# Patient Record
Sex: Male | Born: 1975 | Race: Asian | Hispanic: No | Marital: Single | Smoking: Former smoker
Health system: Southern US, Community
[De-identification: ages and names within clinical notes are randomized; demographics above are authoritative.]

## PROBLEM LIST (undated history)

## (undated) DIAGNOSIS — K219 Gastro-esophageal reflux disease without esophagitis: Secondary | ICD-10-CM

## (undated) DIAGNOSIS — L508 Other urticaria: Secondary | ICD-10-CM

## (undated) DIAGNOSIS — R21 Rash and other nonspecific skin eruption: Secondary | ICD-10-CM

## (undated) DIAGNOSIS — Z87898 Personal history of other specified conditions: Secondary | ICD-10-CM

## (undated) DIAGNOSIS — E785 Hyperlipidemia, unspecified: Secondary | ICD-10-CM

## (undated) DIAGNOSIS — Z9109 Other allergy status, other than to drugs and biological substances: Secondary | ICD-10-CM

## (undated) DIAGNOSIS — T7840XA Allergy, unspecified, initial encounter: Secondary | ICD-10-CM

## (undated) DIAGNOSIS — G4730 Sleep apnea, unspecified: Secondary | ICD-10-CM

## (undated) HISTORY — DX: Hyperlipidemia, unspecified: E78.5

## (undated) HISTORY — DX: Rash and other nonspecific skin eruption: R21

## (undated) HISTORY — DX: Allergy, unspecified, initial encounter: T78.40XA

## (undated) HISTORY — DX: Sleep apnea, unspecified: G47.30

## (undated) HISTORY — DX: Other allergy status, other than to drugs and biological substances: Z91.09

## (undated) HISTORY — DX: Personal history of other specified conditions: Z87.898

## (undated) HISTORY — PX: NO PAST SURGERIES: SHX2092

## (undated) HISTORY — DX: Gastro-esophageal reflux disease without esophagitis: K21.9

## (undated) HISTORY — DX: Other urticaria: L50.8

---

## 2012-02-17 ENCOUNTER — Encounter (HOSPITAL_COMMUNITY): Payer: Self-pay

## 2012-02-17 ENCOUNTER — Emergency Department (HOSPITAL_COMMUNITY)
Admission: EM | Admit: 2012-02-17 | Discharge: 2012-02-17 | Disposition: A | Discharge status: Home / Self Care | Payer: Self-pay | Attending: Emergency Medicine | Admitting: Emergency Medicine

## 2012-02-17 DIAGNOSIS — IMO0002 Reserved for concepts with insufficient information to code with codable children: Secondary | ICD-10-CM | POA: Insufficient documentation

## 2012-02-17 DIAGNOSIS — H10219 Acute toxic conjunctivitis, unspecified eye: Secondary | ICD-10-CM | POA: Insufficient documentation

## 2012-02-17 DIAGNOSIS — S0510XA Contusion of eyeball and orbital tissues, unspecified eye, initial encounter: Secondary | ICD-10-CM | POA: Insufficient documentation

## 2012-02-17 DIAGNOSIS — H5789 Other specified disorders of eye and adnexa: Secondary | ICD-10-CM | POA: Insufficient documentation

## 2012-02-17 DIAGNOSIS — H5710 Ocular pain, unspecified eye: Secondary | ICD-10-CM | POA: Insufficient documentation

## 2012-02-17 DIAGNOSIS — H11419 Vascular abnormalities of conjunctiva, unspecified eye: Secondary | ICD-10-CM | POA: Insufficient documentation

## 2012-02-17 MED ORDER — HYDROCODONE-ACETAMINOPHEN 5-325 MG PO TABS: ORAL_TABLET | ORAL | Status: AC

## 2012-02-17 MED ORDER — TOBRAMYCIN 0.3 % OP OINT
TOPICAL_OINTMENT | Freq: Once | OPHTHALMIC | Status: AC
  Administered 2012-02-17: 10:00:00 via OPHTHALMIC
  Filled 2012-02-17: qty 3.5

## 2012-02-17 NOTE — ED Provider Notes (Signed)
History     CSN: [id number]  Arrival date & time 02/17/12  0827   First MD Initiated Contact with Patient 02/17/12 [phone]      Chief Complaint  Patient presents with  . Eye Injury    (Consider location/radiation/quality/duration/timing/severity/associated sxs/prior treatment) HPI Comments: Patient was opening a container of superglue today at work when it shot out and entered right eye. Patient initially irrigated the eye with water. No other treatments prior to arrival. No contact lenses. Patient notes a "scratchy" sensation in his eye. He denies change in vision. He denies eye pain, vomiting.   Patient is a 36 y.o. male presenting with eye injury. The history is provided by the patient.  Eye Injury This is a new problem. The current episode started today. The problem occurs constantly. The problem has been unchanged. Pertinent negatives include no coughing, fever, headaches, myalgias, nausea, rash, sore throat, visual change or vomiting. The symptoms are aggravated by nothing. Treatments tried: irrigation. The treatment provided no relief.    History reviewed. No pertinent past medical history.  History reviewed. No pertinent past surgical history.  No family history on file.  History  Substance Use Topics  . Smoking status: Current Everyday Smoker  . Smokeless tobacco: Not on file  . Alcohol Use: Yes     occasional      Review of Systems  Constitutional: Negative for fever.  HENT: Negative for sore throat and rhinorrhea.   Eyes: Positive for redness. Negative for photophobia, pain and visual disturbance.  Respiratory: Negative for cough.   Gastrointestinal: Negative for nausea and vomiting.  Musculoskeletal: Negative for myalgias.  Skin: Negative for rash.  Neurological: Negative for headaches.    Allergies  Review of patient's allergies indicates no known allergies.  Home Medications  No current outpatient prescriptions on file.  BP 138/98  Pulse 88   Temp(Src) 98.1 F (36.7 C) (Oral)  Resp 18  SpO2 98%  Physical Exam  Nursing note and vitals reviewed. Constitutional: He is oriented to person, place, and time. He appears well-developed and well-nourished.  HENT:  Head: Normocephalic and atraumatic.  Eyes: Pupils are equal, round, and reactive to light. Right eye exhibits discharge (Tearing). Right eye exhibits no chemosis and no exudate. Left eye exhibits no chemosis, no discharge and no exudate. Right conjunctiva is injected. Right conjunctiva has no hemorrhage. Left conjunctiva is not injected. Left conjunctiva has no hemorrhage. Right eye exhibits normal extraocular motion. Left eye exhibits normal extraocular motion.       Dried beads of glue on R eyelashes.   Neck: Normal range of motion. Neck supple.  Pulmonary/Chest: No respiratory distress.  Neurological: He is alert and oriented to person, place, and time.  Skin: Skin is warm and dry.  Psychiatric: He has a normal mood and affect.    ED Course  Procedures (including critical care time)  Labs Reviewed - No data to display No results found.   1. Chemical conjunctivitis     9:10AM Patient seen and examined. Work-up initiated. Medications ordered.   Vital signs reviewed and are as follows: Filed Vitals:   02/17/12 0829  BP: 138/98  Pulse: 88  Temp: 98.1 F (36.7 C)  Resp: 18   Patient was discussed with Forbes Cellar, MD  9:32 AM Chemical conjunctivitis. Will apply tobrex ointment after performing visual acuity.   10:08 AM Visual acuity initially listed as unable to be obtained with R eye. Nurse and myself repeated exam, patient had better than 20/30 vision in  Right eye.   Urged follow-up with ophthalmology in 2 days. Patient urged to return with worsening pain, vision loss, swelling around eye, fever, or other concerns. He verbalizes understanding and agrees with plan.   MDM  Super glue into eye causing a chemical conjunctivitis. Vision is at patient's  baseline. Antibiotic ointment to prevent infection and help dissolve glue. Not concerned for ulcer or abrasion at this point. Ophtho referral given.         Renne Crigler, [place] 02/17/12 1019

## 2012-02-17 NOTE — Discharge Instructions (Signed)
Please read and follow all provided instructions.  Your diagnoses today include:  1. Chemical conjunctivitis    Tests performed today include:  Visual acuity testing to check your vision  Vital signs. See below for your results today.   Medications prescribed:   Tobrex (tobramycin) - antibiotic eye drops or eye ointment  Use this medication as follows:  Apply 1/4" of the antibiotic ointment to your right eye 5 times per day.     Vicodin (hydrocodone/acetaminophen) - narcotic pain medication  You have been prescribed narcotic pain medication such as Vicodin or Percocet: DO NOT drive or perform any activities that require you to be awake and alert because this medicine can make you drowsy. Do not take any other medications that contain tylenol while taking this medication because you might take too much.   Take any prescribed medications only as directed.  Home care instructions:  Follow any educational materials contained in this packet.  Follow-up instructions: Please follow-up with the opthalmologist listed in the next 2 days for further evaluation of your symptoms.  If you do not have a primary care doctor -- see below for referral information.   Return instructions:   Please return to the Emergency Department if you experience worsening symptoms.   Please return immediately if you develop severe pain, pus drainage, new change in vision, or fever.  Please return if you have any other emergent concerns.  Additional Information:  Your vital signs today were: BP 138/98  Pulse 88  Temp(Src) 98.1 F (36.7 C) (Oral)  Resp 18  SpO2 98% If your blood pressure (BP) was elevated above 135/85 this visit, please have this repeated by your doctor within one month. -------------- No Primary Care Doctor Call [facility]  [phone] Other agencies that provide inexpensive medical care    [facility]  [phone]    [facility]  [phone]  [facility]  [phone]    [facility]  [phone]    [facility]  [phone]    [facility]  [phone] -------------- RESOURCE GUIDE:  Dental Problems  Patients with Medicaid: [facility] [phone]                                            [phone] [address] Phone:  [phone]                                                   Phone:  [phone]  If unable to pay or uninsured, contact:  [facility] or [facility]. to become qualified for the adult dental clinic.  Chronic Pain Problems Contact [facility]  [phone] Patients need to be referred by their primary care doctor.  Insufficient Money for Medicine Contact [employer]:  call "211" or [facility] [phone].  Psychological Services [facility]  [phone] [facility]  [phone] [facility]   [phone] (emergency services [phone])  Substance Abuse Resources [facility]  [phone] [facility] [phone] [facility] [phone]  [facility] [phone] Residential & Outpatient Substance Abuse Program  [phone]  Abuse/Neglect [facility] Child Abuse Hotline [phone] [facility] Child Abuse Hotline [phone] (After Hours)  Emergency Shelter [facility] [phone]  Maternity Homes [facility] [phone] [facility] [phone]  [facility]  [facility]     [employer]                          [facility] Dept. [address] [place]                       [address]      [address]  [place]ondell Reveal Phone:  [phone]                                   Phone:  [phone]                  Phone:  [phone]  [facility] Mental [facility] Phone:  [phone]  [facility] [address] Child Abuse Hotline [phone])[phone] [phone] (After Hours)

## 2012-02-17 NOTE — ED Provider Notes (Signed)
Medical screening examination/treatment/procedure(s) were performed by non-physician practitioner and as supervising physician I was immediately available for consultation/collaboration.   Forbes Cellar, MD 02/17/12 1024

## 2012-02-17 NOTE — ED Notes (Signed)
Pt. Got super glue into his Rt. Eye,   Unable to open it, glued shut

## 2012-02-17 NOTE — ED Notes (Signed)
Pt reports accidentally squirting super glue in his (R) eye this am, eye can be opened w/assistance, pt reports pain while trying to attempt to open eye.

## 2012-10-01 ENCOUNTER — Ambulatory Visit: Payer: Self-pay | Admitting: Internal Medicine

## 2012-10-01 VITALS — BP 124/79 | HR 82 | Temp 98.2°F | Resp 16 | Ht 66.5 in | Wt 161.8 lb

## 2012-10-01 DIAGNOSIS — T783XXA Angioneurotic edema, initial encounter: Secondary | ICD-10-CM

## 2012-10-01 DIAGNOSIS — R209 Unspecified disturbances of skin sensation: Secondary | ICD-10-CM

## 2012-10-01 DIAGNOSIS — S41109A Unspecified open wound of unspecified upper arm, initial encounter: Secondary | ICD-10-CM

## 2012-10-01 MED ORDER — METHYLPREDNISOLONE ACETATE 80 MG/ML IJ SUSP
120.0000 mg | Freq: Once | INTRAMUSCULAR | Status: AC
  Administered 2012-10-01: 120 mg via INTRAMUSCULAR

## 2012-10-01 NOTE — Progress Notes (Signed)
  Subjective:    Patient ID: Ricardo Gibbs, male    DOB: 03/10/1976, 36 y.o.   MRN: [id number]  HPI Has definite insect bite right shoulder, occurred last night in bed. Has gradually enlarging ring of angioedema from wound. Itchy and tingly, no pain. No allergy hx.   Review of Systems     Objective:   Physical Exam  Constitutional: He is oriented to person, place, and time. He appears well-developed and well-nourished.  HENT:  Mouth/Throat: Oropharynx is clear and moist.  Cardiovascular: Normal rate.   Pulmonary/Chest: Effort normal and breath sounds normal.  Musculoskeletal: He exhibits edema.  Neurological: He is alert and oriented to person, place, and time.  Skin: Lesion and rash noted. Rash is maculopapular.          Edematous border  Psychiatric: He has a normal mood and affect.          Assessment & Plan:  depomedrol 120mg  Zyrtec 10mg  now Benedryl 50mg  after work [employer]. RTC if worse

## 2012-10-01 NOTE — Patient Instructions (Signed)
Spider Bite Spider bites are not common. Most spider bites do not cause serious problems. The elderly, very young children, and people with certain existing medical conditions are more likely to experience significant symptoms. SYMPTOMS  Spider bites may not cause any pain at first. Within 1 or 2 days of the bite, there may be swelling, redness, and pain in the bite area. However, some spider bites can cause pain within the first hour. TREATMENT  Your caregiver may prescribe antibiotic medicine if a bacterial infection develops in the bite. However, not all spider bites require antibiotics or prescription medicines.  HOME CARE INSTRUCTIONS  Do not scratch the bite area.  Keep the bite area clean and dry. Wash the area with soap and water as directed.  Put ice or cool compresses on the bite area.  Put ice in a plastic bag.  Place a towel between your skin and the bag.  Leave the ice on for 20 minutes, 4 times a day for the first 2 to 3 days, or as directed.  Keep the bite area elevated above the level of your heart. This helps reduce redness and swelling.  Only take over-the-counter or prescription medicines as directed by your caregiver.  If you are given antibiotics, take them as directed. Finish them even if you start to feel better. You may need a tetanus shot if:  You cannot remember when you had your last tetanus shot.  You have never had a tetanus shot.  The injury broke your skin. If you get a tetanus shot, your arm may swell, get red, and feel warm to the touch. This is common and not a problem. If you need a tetanus shot and you choose not to have one, there is a rare chance of getting tetanus. Sickness from tetanus can be serious. SEEK MEDICAL CARE IF: Your bite is not better after 3 days of treatment. SEEK IMMEDIATE MEDICAL CARE IF:  Your bite turns purple or develops increased swelling, pain, or redness.  You develop shortness of breath or chest pain.  You have  muscle cramps or painful muscle spasms.  You develop abdominal pain, nausea, or vomiting.  You feel unusually tired or sleepy. MAKE SURE YOU:  Understand these instructions.  Will watch your condition.  Will get help right away if you are not doing well or get worse. Document Released: 12/14/2004 Document Revised: 01/29/2012 Document Reviewed: 06/07/2011 [facility] Patient Information 2013 [place], [place]. Angioedema Angioedema (AE) is a sudden swelling of the eyelids, lips, lobes of ears, external genitalia, skin, and other parts of the body. AE can happen by itself. It usually begins during the night and is found on awakening. It can happen with hives and other allergic reactions. Attacks can be mild and annoying, or life-threatening if the air passages swell. AE generally occurs in a short time period (over minutes to hours) and gets better in 24 to 48 hours. It usually does not cause any serious problems.  There are 2 different kinds of AE:   Allergic AE.  Nonallergic AE.  There may be an overreaction or direct stimulation of cells that are a part of the immune system (mast cells).  There may be problems with the release of chemicals made by the body that cause swelling and inflammation (kinins). AE due to kinins can be inherited from parents (hereditary), or it can develop on its own (acquired). Acquired AE either shows up before, or along with, certain diseases or is due to the body's immune system  attacking parts of the body's own cells (autoimmune). CAUSES  Allergic  AE due to allergic reactions are caused by something that causes the body to react (trigger). Common triggers include:  Foods.  Medicines.  Latex.  Direct contact with certain fruits, vegetables, or animal saliva.  Insect stings. Nonallergic  Mast cell stimulation may be caused by:  Medicines.  Dyes used in X-rays.  The body's own immune system reactions to parts of the body (autoimmune  disease).  Possibly, some virus infections.  AE due to problems with kinins can be hereditary or acquired. Attacks are triggered by:  Mild injury.  Dental work or any surgery.  Stress.  Sudden changes in temperature.  Exercise.  Medicines.  AE due to problems with kinins can also be due to certain medicines, especially blood pressure medicines like angiotensin-converting enzyme (ACE) inhibitors. African Americans are at nearly 5 times greater risk of developing AE than Caucasians from ACE inhibitors. SYMPTOMS  Allergic symptoms:  Non-itchy swelling of the skin. Often the swelling is on the face and lips, but any area of the skin can swell. Sometimes, the swelling can be painful. If hives are present, there is intense itching.  Breathing problems if the air passages swell. Nonallergic symptoms:  If internal organs are involved, there may be:  Nausea.  Abdominal pain.  Vomiting.  Difficulty swallowing.  Difficulty passing urine.  Breathing problems if the air passages swell. Depending on the cause of AE, episodes may:  Only happen once (if triggers are removed or avoided).  Come back in unpredictable patterns.  Repeat for several years and then gradually fade away. DIAGNOSIS  AE is diagnosed by:   Asking questions to find out how fast the symptoms began.  Taking a family history.  Physical exam.  Diagnostic tests. Tests could include:  Allergy skin tests to see if the problem is allergic.  Blood tests to diagnose hereditary and some acquired types of AE.  Other tests to see if there is a hidden disease leading to the AE. TREATMENT  Treatment depends on the type and cause (if any) of the AE. Allergic  Allergic types of AE are treated with:  Immediate removal of the trigger or medicine (if any).  Epinephrine injection.  Steroids.  Antihistamines.  Hospitalization for severe attacks. Nonallergic  Mast cell stimulation types of AE are treated  with:  Immediate removal of the trigger or medicine (if any).  Epinephrine injection.  Steroids.  Antihistamines.  Hospitalization for severe attacks.  Hereditary AE is treated with:  Medicines to prevent and treat attacks. There is little response to antihistamines, epinephrine, or steroids.  Preventive medicines before dental work or surgery.  Removing or avoiding medicines that trigger attacks.  Hospitalization for severe attacks.  Acquired AE is treated with:  Treating underlying disease (if any).  Medicines to prevent and treat attacks. HOME CARE INSTRUCTIONS   Always carry your emergency allergy treatment medicines with you.  Wear a medical bracelet.  Avoid known triggers. SEEK MEDICAL CARE IF:   You get repeat attacks.  Your attacks are more frequent or more severe despite preventive measures.  You have hereditary AE and are considering having children. It is important to discuss the risks of passing this on to your children. SEEK IMMEDIATE MEDICAL CARE IF:   You have difficulty breathing.  You have difficulty swallowing.  You experience fainting. This condition should be treated immediately. It can be life-threatening if it involves throat swelling. Document Released: 01/15/2002 Document Revised: 01/29/2012 Document  Reviewed: 11/05/2008 [employer] Patient Information 2013 [place], [place].

## 2013-09-11 ENCOUNTER — Telehealth: Payer: Self-pay

## 2013-09-11 NOTE — Telephone Encounter (Signed)
Medication List and allergies: reviewed  90 day supply/mail order: na Local prescriptions: [facility]  Immunizations due: as determined by provider  A/P:   Opti drop and additional allergy med will be brought to appt Reviewed FH, SH, PMH No history of illness in his family  Currently seeing allergist for continual rash that will not go away No immunization records or PCP records  To Discuss with Provider: Experiences chronic constipation

## 2013-09-15 ENCOUNTER — Ambulatory Visit
Admission: RE | Admit: 2013-09-15 | Discharge: 2013-09-15 | Disposition: A | Discharge status: Home / Self Care | Payer: BC Managed Care – PPO | Source: Ambulatory Visit | Attending: Family Medicine | Admitting: Family Medicine

## 2013-09-15 ENCOUNTER — Encounter: Payer: Self-pay | Admitting: Family Medicine

## 2013-09-15 ENCOUNTER — Ambulatory Visit (INDEPENDENT_AMBULATORY_CARE_PROVIDER_SITE_OTHER): Payer: BC Managed Care – PPO | Admitting: Family Medicine

## 2013-09-15 VITALS — BP 120/80 | HR 69 | Temp 98.4°F | Ht 67.0 in | Wt 161.4 lb

## 2013-09-15 DIAGNOSIS — K5901 Slow transit constipation: Secondary | ICD-10-CM

## 2013-09-15 DIAGNOSIS — K5900 Constipation, unspecified: Secondary | ICD-10-CM

## 2013-09-15 LAB — POCT URINALYSIS DIPSTICK
Bilirubin, UA: NEGATIVE
Ketones, UA: NEGATIVE
Leukocytes, UA: NEGATIVE
Spec Grav, UA: 1.015
pH, UA: 6

## 2013-09-15 MED ORDER — LINACLOTIDE 145 MCG PO CAPS: 145.0000 ug | ORAL_CAPSULE | Freq: Every day | ORAL | Status: DC

## 2013-09-15 NOTE — Progress Notes (Signed)
  Subjective:     Ricardo Gibbs is a 37 y.o. male who presents for evaluation of constipation. Onset was several years ago. Patient has been having rare pellet like stools 1x a week. Defecation has been difficult and painful. Co-Morbid conditions:stress. Symptoms have been constant. Current Health Habits: Eating fiber? yes - metamucil and fiber one, Exercise? no, Adequate hydration? no. Current over the counter/prescription laxative: metamucil , stool softener,  ducolax which has been ineffective.  The following portions of the patient's history were reviewed and updated as appropriate: allergies, current medications, past family history, past medical history, past social history, past surgical history and problem list.  Review of Systems Pertinent items are noted in HPI.   Objective:    BP 120/80  Pulse 69  Temp(Src) 98.4 F (36.9 C) (Oral)  Ht 5\' 7"  (1.702 m)  Wt 161 lb 6.4 oz (73.211 kg)  BMI 25.27 kg/m2  SpO2 97% General appearance: alert, cooperative, appears stated age and no distress Lungs: clear to auscultation bilaterally Heart: S1, S2 normal Abdomen: soft, non-tender; bowel sounds normal; no masses,  no organomegaly   Assessment:    Constipation   Plan:    Education about constipation causes and treatment discussed. Laboratory tests per orders. Plain films (flat plate/upright). GI consult. samples linzess 145 mcg

## 2013-09-15 NOTE — Patient Instructions (Signed)

## 2013-09-16 LAB — BASIC METABOLIC PANEL WITH GFR
BUN: 12 mg/dL (ref 6–23)
CO2: 28 meq/L (ref 19–32)
Calcium: 9.5 mg/dL (ref 8.4–10.5)
Chloride: 100 meq/L (ref 96–112)
Creatinine, Ser: 1.1 mg/dL (ref 0.4–1.5)
GFR: 83.75 mL/min
Glucose, Bld: 85 mg/dL (ref 70–99)
Potassium: 3.7 meq/L (ref 3.5–5.1)
Sodium: 138 meq/L (ref 135–145)

## 2013-09-16 LAB — CBC WITH DIFFERENTIAL/PLATELET
Basophils Absolute: 0 10*3/uL (ref 0.0–0.1)
Basophils Relative: 0.4 % (ref 0.0–3.0)
Eosinophils Absolute: 0.2 10*3/uL (ref 0.0–0.7)
Eosinophils Relative: 2.5 % (ref 0.0–5.0)
HCT: 47.2 % (ref 39.0–52.0)
Hemoglobin: 16.5 g/dL (ref 13.0–17.0)
Lymphocytes Relative: 29.4 % (ref 12.0–46.0)
Lymphs Abs: 2.3 10*3/uL (ref 0.7–4.0)
MCHC: 34.9 g/dL (ref 30.0–36.0)
MCV: 89 fl (ref 78.0–100.0)
Monocytes Absolute: 0.6 10*3/uL (ref 0.1–1.0)
Monocytes Relative: 7.2 % (ref 3.0–12.0)
Neutro Abs: 4.7 10*3/uL (ref 1.4–7.7)
Neutrophils Relative %: 60.5 % (ref 43.0–77.0)
Platelets: 249 10*3/uL (ref 150.0–400.0)
RBC: 5.3 Mil/uL (ref 4.22–5.81)
RDW: 12.5 % (ref 11.5–14.6)
WBC: 7.7 10*3/uL (ref 4.5–10.5)

## 2013-09-16 LAB — HEPATIC FUNCTION PANEL
AST: 21 U/L (ref 0–37)
Bilirubin, Direct: 0 mg/dL (ref 0.0–0.3)
Total Bilirubin: 0.7 mg/dL (ref 0.3–1.2)

## 2013-09-16 LAB — TSH: TSH: 2.63 u[IU]/mL (ref 0.35–5.50)

## 2013-10-02 ENCOUNTER — Encounter: Payer: Self-pay | Admitting: Internal Medicine

## 2013-11-03 ENCOUNTER — Encounter: Payer: Self-pay | Admitting: Internal Medicine

## 2013-11-04 ENCOUNTER — Other Ambulatory Visit (INDEPENDENT_AMBULATORY_CARE_PROVIDER_SITE_OTHER): Payer: BC Managed Care – PPO

## 2013-11-04 ENCOUNTER — Ambulatory Visit (INDEPENDENT_AMBULATORY_CARE_PROVIDER_SITE_OTHER): Payer: BC Managed Care – PPO | Admitting: Internal Medicine

## 2013-11-04 ENCOUNTER — Encounter: Payer: Self-pay | Admitting: Internal Medicine

## 2013-11-04 VITALS — BP 108/76 | HR 92 | Ht 65.0 in | Wt 163.1 lb

## 2013-11-04 DIAGNOSIS — R197 Diarrhea, unspecified: Secondary | ICD-10-CM

## 2013-11-04 DIAGNOSIS — K5900 Constipation, unspecified: Secondary | ICD-10-CM

## 2013-11-04 LAB — IGA: IgA: 209 mg/dL (ref 68–378)

## 2013-11-04 NOTE — Patient Instructions (Addendum)
You have been given a separate informational sheet regarding your tobacco use, the importance of quitting and local resources to help you quit. Your physician has requested that you go to the basement for the following lab work before leaving today: Celiac panel. Blood type  Take Linzess 145 mcg every other day.  Call us in 3-4 weeks to let us know if this has helped your diarrhea.                                               We are excited to introduce MyChart, a new best-in-class service that provides you online access to important information in your electronic medical record. We want to make it easier for you to view your health information - all in one secure location - when and where you need it. We expect MyChart will enhance the quality of care and service we provide.  When you register for MyChart, you can:    View your test results.    Request appointments and receive appointment reminders via email.    Request medication renewals.    View your medical history, allergies, medications and immunizations.    Communicate with your physician's office through a password-protected site.    Conveniently print information such as your medication lists.  To find out if MyChart is right for you, please talk to a member of our clinical staff today. We will gladly answer your questions about this free health and wellness tool.  If you are age 20 or older and want a member of your family to have access to your record, you must provide written consent by completing a proxy form available at our office. Please speak to our clinical staff about guidelines regarding accounts for patients younger than age 69.  As you activate your MyChart account and need any technical assistance, please call the MyChart technical support line at [phone]) or email your question to [email or web address]. If you email your question(s), please include your name, a return phone number and  the best time to reach you.  If you have non-urgent health-related questions, you can send a message to our office through MyChart at [email or web address]. If you have a medical emergency, call 911.  Thank you for using MyChart as your new health and wellness resource!   MyChart licensed from [employer],  1191-4782. Patents Pending.

## 2013-11-04 NOTE — Progress Notes (Signed)
Patient ID: Ricardo Gibbs, male   DOB: 03-Jul-1976, 37 y.o.   MRN: [id number] HPI: Ricardo Gibbs is a 37 yo male with past medical history of angioedema and GERD who is seen in consultation at request of Dr. Laury Axon to evaluate constipation.  He reports somewhat long-standing constipation which is associated with lower and at times right-sided abdominal discomfort. He tried over-the-counter laxatives such as Dulcolax but found these to be ineffective. He was started on Linzess 145 mcg daily which she says has relieved his constipation but he is having diarrhea. He is having at least 2-3 watery stools daily. At times he's had "accidents" due to urgent stools associated with Linzess. He never had fecal incontinence or accidents before. He's also noted some mild abdominal bloating with Linzess which also wasn't present before. He's never had blood in his stool nor melena. Appetite has remained good. No weight loss. No nausea or vomiting. No heartburn. 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 He knows this doesn't help neither constipation and hasn't worked with a loose stools associated with Linzess. Since starting Linzess he's had no further abdominal pain, which was mild before when constipated.  No family history of GI malignancy, celiac disease or IBD  Past Medical History  Diagnosis Date  . Hx of angioedema   . Rash   . Environmental allergies     Pecan Pollen  . GERD (gastroesophageal reflux disease)     History reviewed. No pertinent past surgical history.  Current Outpatient Prescriptions  Medication Sig Dispense Refill  . levocetirizine (XYZAL) 5 MG tablet Take 5 mg by mouth every evening.      . Linaclotide (LINZESS) 145 MCG CAPS capsule Take 1 capsule (145 mcg total) by mouth daily.  30 capsule  2   No current facility-administered medications for this visit.    No Known Allergies  Family  History  Problem Relation Age of Onset  . Stomach cancer Paternal Grandmother   . Thyroid disease Mother     Removal of the Thyroid    History  Substance Use Topics  . Smoking status: Current Every Day Smoker    Types: Cigarettes  . Smokeless tobacco: Never Used  . Alcohol Use: Yes     Comment: occasional    ROS: As per history of present illness, otherwise negative  BP 108/76  Pulse 92  Ht 5\' 5"  (1.651 m)  Wt 163 lb 2 oz (73.993 kg)  BMI 27.15 kg/m2 Constitutional: Well-developed and well-nourished. No distress. HEENT: Normocephalic and atraumatic. Oropharynx is clear and moist. No oropharyngeal exudate. Conjunctivae are normal.  No scleral icterus. Cardiovascular: Normal rate, regular rhythm and intact distal pulses. No M/R/G Pulmonary/chest: Effort normal and breath sounds normal. No wheezing, rales or rhonchi. Abdominal: Soft, nontender, nondistended. Bowel sounds active throughout. There are no masses palpable. No hepatosplenomegaly. Extremities: no clubbing, cyanosis, or edema Lymphadenopathy: No cervical adenopathy noted. Neurological: Alert and oriented to person place and time. Skin: Skin is warm and dry. No rashes noted. Psychiatric: Normal mood and affect. Behavior is normal.  RELEVANT LABS AND IMAGING: CBC    Component Value Date/Time   WBC 7.7 09/15/2013 1451   RBC 5.30 09/15/2013 1451   HGB 16.5 09/15/2013 1451   HCT 47.2 09/15/2013 1451   PLT 249.0 09/15/2013 1451   MCV 89.0 09/15/2013 1451   MCHC 34.9 09/15/2013 1451  RDW 12.5 09/15/2013 1451   LYMPHSABS 2.3 09/15/2013 1451   MONOABS 0.6 09/15/2013 1451   EOSABS 0.2 09/15/2013 1451   BASOSABS 0.0 09/15/2013 1451    CMP     Component Value Date/Time   NA 138 09/15/2013 1451   K 3.7 09/15/2013 1451   CL 100 09/15/2013 1451   CO2 28 09/15/2013 1451   GLUCOSE 85 09/15/2013 1451   BUN 12 09/15/2013 1451   CREATININE 1.1 09/15/2013 1451   CALCIUM 9.5 09/15/2013 1451   PROT 7.0 09/15/2013 1451    ALBUMIN 4.5 09/15/2013 1451   AST 21 09/15/2013 1451   ALT 25 09/15/2013 1451   ALKPHOS 62 09/15/2013 1451   BILITOT 0.7 09/15/2013 1451   TSH normal  ASSESSMENT/PLAN:  37 yo male with past medical history of angioedema and GERD who is seen in consultation at request of Dr. Laury Axon to evaluate constipation.   1.  Constipation/now diarrhea medication associated -- he has had diarrhea since starting Linzess 145 mcg daily, but he is pleased with improvement in his abdominal discomfort.  We discussed options which include changing Linzess to every other day or changing therapy altogether. He would prefer trying Linzess 145 mcg every other day. I told him the goal is to have effective bowel movements but not necessarily diarrhea. If Linzess leads to diarrhea on the days that he takes it, I would recommend changing therapy. He understands this and agrees. He will try Linzess every other day for the next 3-4 weeks and then call my office to update me on how he has responded. If he likes the response we will continue with therapy in this manner however if he is still having intermittent diarrhea, we will plan to change to lubiprostone 8 mcg twice daily.  I will check a celiac panel today. He also asked me to check a blood type because he has always been interested to know his blood type.  Return in 2-3 months

## 2013-11-06 ENCOUNTER — Other Ambulatory Visit: Payer: Self-pay | Admitting: Gastroenterology

## 2013-11-07 ENCOUNTER — Telehealth: Payer: Self-pay | Admitting: Internal Medicine

## 2013-11-07 NOTE — Telephone Encounter (Signed)
Pt called for his Blood Type and after speaking to the lab, informed him we cannot do that test here; it would have to be done in the hospital.  Offered to order the test at a hospital and he stated that was all right.

## 2014-06-17 IMAGING — CR DG ABDOMEN 2V
2 series · 2 of 2 positions shown · non-contrast
Comparison: None.

CLINICAL DATA: Right-sided periumbilical and left-sided mid
abdominal pain for months. Question constipation.

EXAM:
ABDOMEN - 2 VIEW

[view not recorded (1 of 2)]
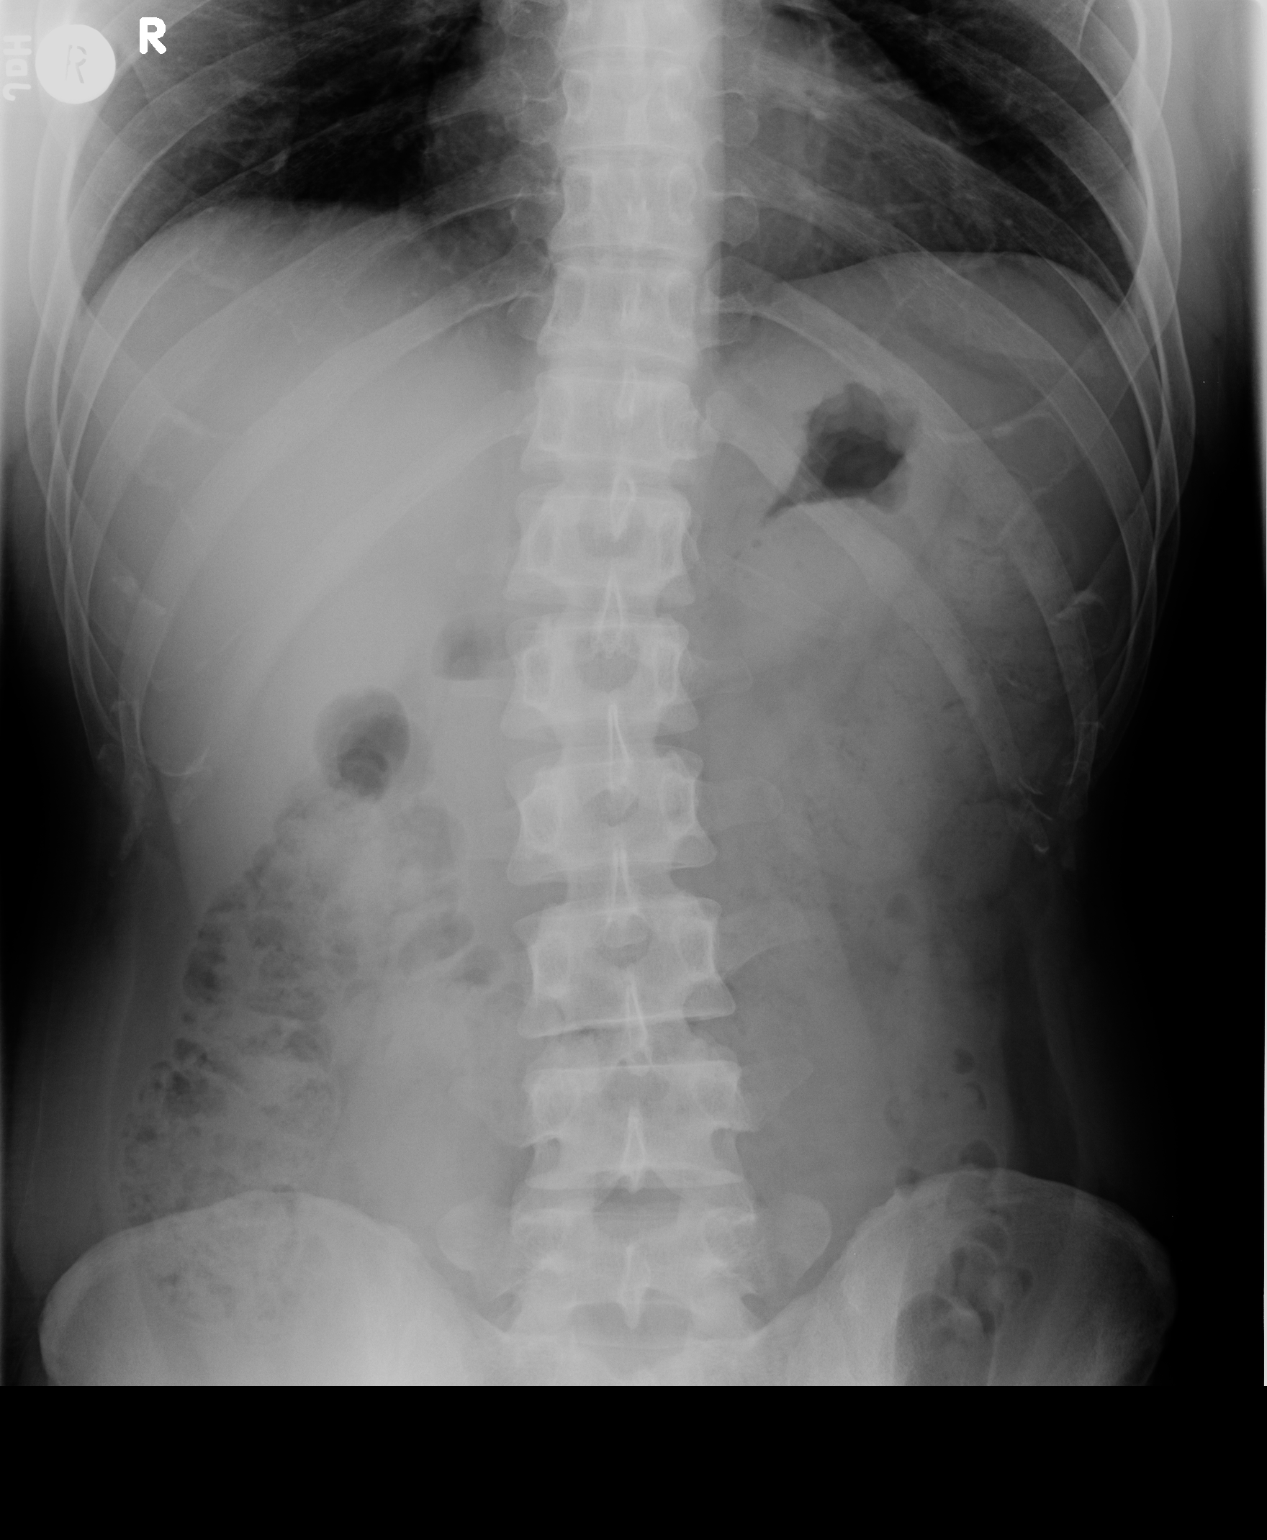

[view not recorded (2 of 2)]
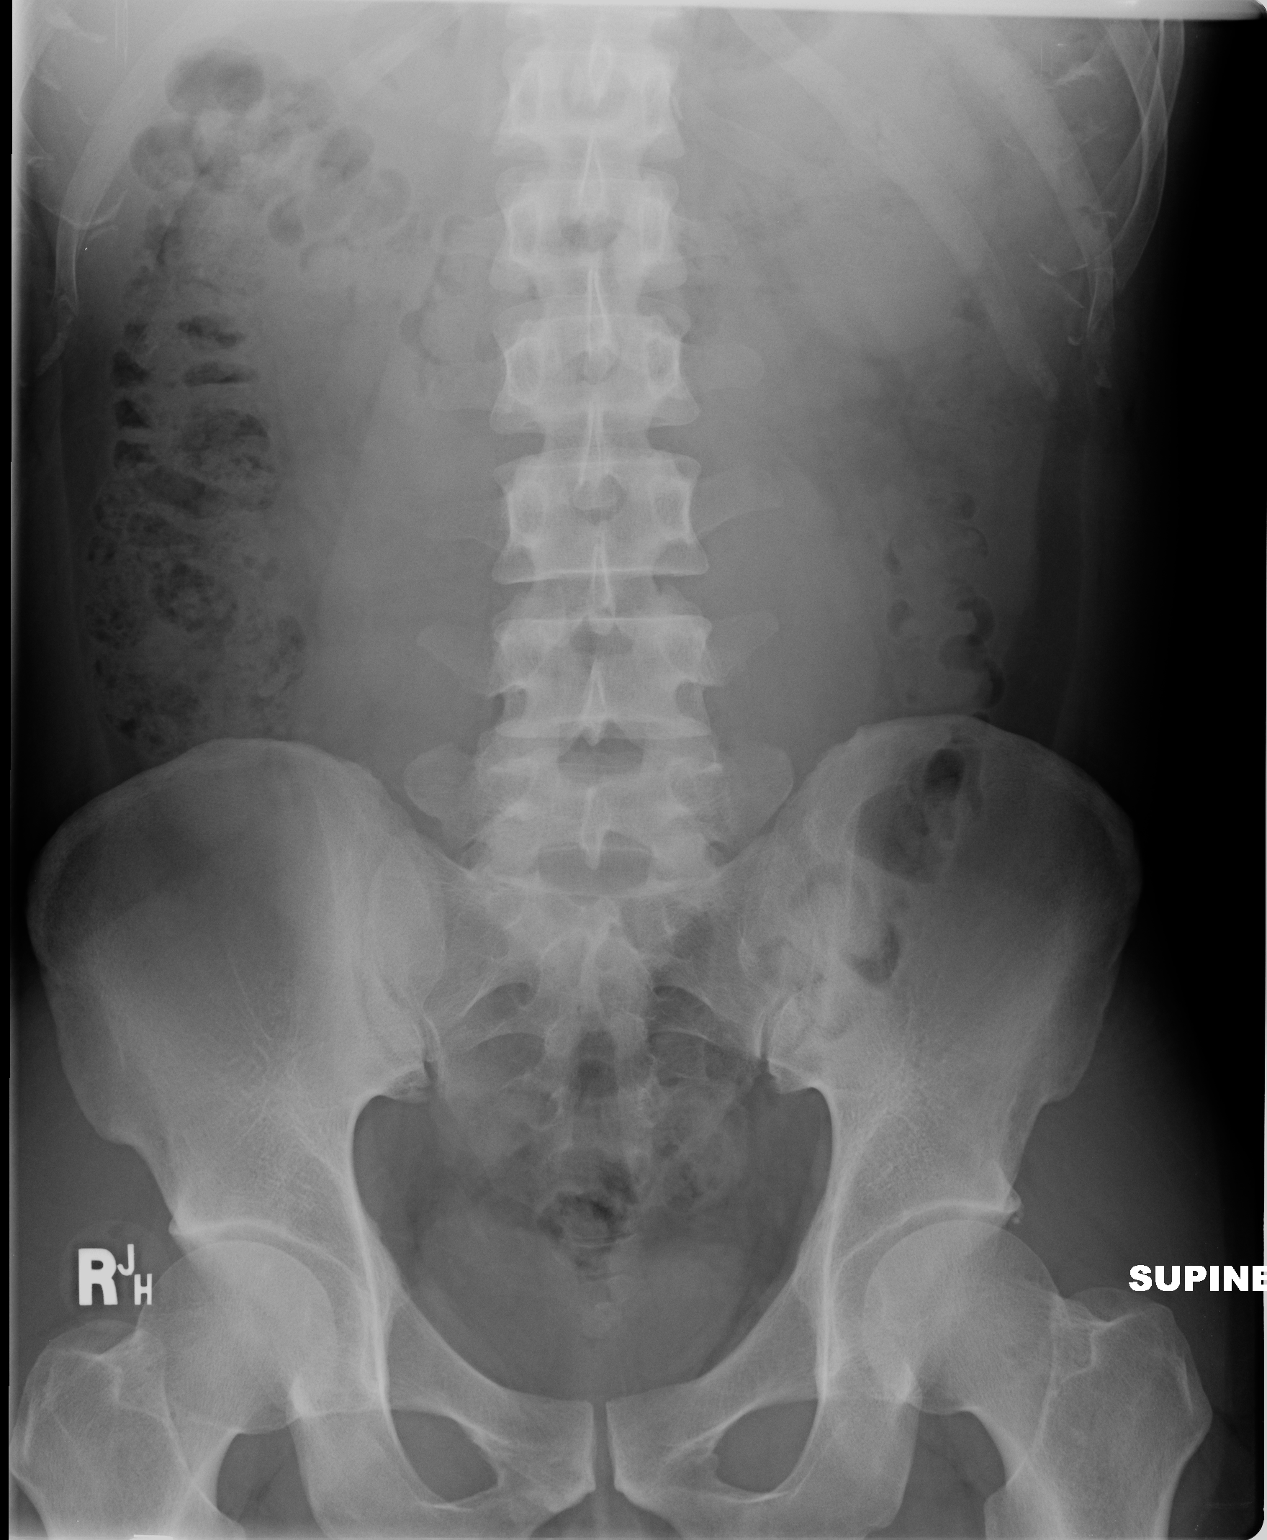

[2 of 2 positions shown; findings below may reference images not displayed]

FINDINGS: Upright and supine views. The upright view demonstrates minimal
convex right lumbar spine curvature. No free intraperitoneal air. No
significant air fluid levels.

Supine view demonstrates moderate amount of right-sided colonic
stool. No small bowel dilatation. Distal gas and stool. No abnormal
abdominal calcifications. No appendicolith.
IMPRESSION: No acute findings.

Moderate amount of ascending colonic stool.

## 2016-09-07 ENCOUNTER — Ambulatory Visit (HOSPITAL_COMMUNITY)
Admission: EM | Admit: 2016-09-07 | Discharge: 2016-09-07 | Disposition: A | Discharge status: Home / Self Care | Payer: BLUE CROSS/BLUE SHIELD

## 2016-09-07 ENCOUNTER — Encounter (HOSPITAL_COMMUNITY): Payer: Self-pay | Admitting: Emergency Medicine

## 2016-09-07 DIAGNOSIS — L508 Other urticaria: Secondary | ICD-10-CM | POA: Diagnosis not present

## 2016-09-07 MED ORDER — HYDROXYZINE HCL 25 MG PO TABS: 25.0000 mg | ORAL_TABLET | Freq: Four times a day (QID) | ORAL | 3 refills | Status: DC

## 2016-09-07 MED ORDER — FAMOTIDINE 40 MG PO TABS: 40.0000 mg | ORAL_TABLET | Freq: Every day | ORAL | 1 refills | Status: DC

## 2016-09-07 MED ORDER — METHYLPREDNISOLONE ACETATE 80 MG/ML IJ SUSP
80.0000 mg | Freq: Once | INTRAMUSCULAR | Status: AC
  Administered 2016-09-07: 80 mg via INTRAMUSCULAR

## 2016-09-07 MED ORDER — METHYLPREDNISOLONE ACETATE 80 MG/ML IJ SUSP
INTRAMUSCULAR | Status: AC
  Filled 2016-09-07: qty 1

## 2016-09-07 NOTE — ED Provider Notes (Signed)
[facility]    CSN: [id number] Arrival date & time: 09/07/16  1234     History   Chief Complaint Chief Complaint  Patient presents with  . Sore Throat  . Facial Swelling    HPI Ricardo Gibbs is a 40 y.o. male.   The history is provided by the patient.  Sore Throat  This is a chronic problem. The current episode started 3 to 5 hours ago. The problem has not changed since onset.Pertinent negatives include no chest pain and no shortness of breath. Treatments tried: seen by dr Dixon Callassharma, still with recurrence.    Past Medical History:  Diagnosis Date  . Environmental allergies    Pecan Pollen  . GERD (gastroesophageal reflux disease)   . Hx of angioedema   . Rash     Patient Active Problem List   Diagnosis Date Noted  . Unspecified constipation 11/04/2013    History reviewed. No pertinent surgical history.     Home Medications    Prior to Admission medications   Medication Sig Start Date End Date Taking? Authorizing Provider  cetirizine (ZYRTEC) 10 MG tablet Take 10 mg by mouth daily.   Yes Historical Provider, MD  levocetirizine (XYZAL) 5 MG tablet Take 5 mg by mouth every evening.    Historical Provider, MD  Linaclotide Karlene Einstein(LINZESS) 145 MCG CAPS capsule Take 1 capsule (145 mcg total) by mouth daily. 09/15/13   Donato SchultzYvonne R Lowne Chase, DO    Family History Family History  Problem Relation Age of Onset  . Stomach cancer Paternal Grandmother   . Thyroid disease Mother     Removal of the Thyroid    Social History Social History  Substance Use Topics  . Smoking status: Current Every Day Smoker    Types: Cigarettes  . Smokeless tobacco: Never Used  . Alcohol use Yes     Comment: occasional     Allergies   Review of patient's allergies indicates no known allergies.   Review of Systems Review of Systems  HENT: Positive for facial swelling and sore throat. Negative for trouble swallowing.   Respiratory: Negative for shortness of breath and wheezing.    Cardiovascular: Negative for chest pain.  All other systems reviewed and are negative.    Physical Exam Triage Vital Signs ED Triage Vitals  Enc Vitals Group     BP 09/07/16 1320 129/86     Pulse Rate 09/07/16 1320 77     Resp 09/07/16 1320 12     Temp 09/07/16 1320 98.2 F (36.8 C)     Temp Source 09/07/16 1320 Oral     SpO2 09/07/16 1320 100 %     Weight --      Height --      Head Circumference --      Peak Flow --      Pain Score 09/07/16 1333 3     Pain Loc --      Pain Edu? --      Excl. in GC? --    No data found.   Updated Vital Signs BP 129/86 (BP Location: Right Arm)   Pulse 77   Temp 98.2 F (36.8 C) (Oral)   Resp 12   SpO2 100%   Visual Acuity Right Eye Distance:   Left Eye Distance:   Bilateral Distance:    Right Eye Near:   Left Eye Near:    Bilateral Near:     Physical Exam  Constitutional: He appears well-developed and well-nourished. No distress.  HENT:  Upper lip mild tingling paresthesias and sts.  Neck: Normal range of motion. Neck supple.  Cardiovascular: Normal rate, regular rhythm, normal heart sounds and intact distal pulses.   Pulmonary/Chest: Effort normal and breath sounds normal. No respiratory distress. He has no wheezes.  Lymphadenopathy:    He has no cervical adenopathy.  Nursing note and vitals reviewed.    UC Treatments / Results  Labs (all labs ordered are listed, but only abnormal results are displayed) Labs Reviewed - No data to display  EKG  EKG Interpretation None       Radiology No results found.  Procedures Procedures (including critical care time)  Medications Ordered in UC Medications - No data to display   Initial Impression / Assessment and Plan / UC Course  I have reviewed the triage vital signs and the nursing notes.  Pertinent labs & imaging results that were available during my care of the patient were reviewed by me and considered in my medical decision making (see chart for  details).  Clinical Course      Final Clinical Impressions(s) / UC Diagnoses   Final diagnoses:  None    New Prescriptions New Prescriptions   No medications on file     Linna Hoff, MD 09/07/16 1501

## 2016-09-07 NOTE — ED Triage Notes (Signed)
Reports sore throat initially.  Then had facial swelling today.  Swelling to cheeks, jaw area.  While in treatment room noted swelling to area above the upper lip and below nose.  Patient is also having large blotchy areas to extremities.  Large blotchy area to right arm.  Takes allergy medicine for this, but not helping.  Reports the "hives" have been evaluated before, not sure of cause.  No difficulty breathing

## 2016-09-11 ENCOUNTER — Ambulatory Visit (INDEPENDENT_AMBULATORY_CARE_PROVIDER_SITE_OTHER): Payer: BLUE CROSS/BLUE SHIELD | Admitting: Allergy & Immunology

## 2016-09-11 ENCOUNTER — Encounter: Payer: Self-pay | Admitting: Allergy & Immunology

## 2016-09-11 VITALS — BP 122/70 | HR 80 | Temp 98.3°F | Resp 16 | Ht 64.57 in | Wt 160.7 lb

## 2016-09-11 DIAGNOSIS — L508 Other urticaria: Secondary | ICD-10-CM | POA: Diagnosis not present

## 2016-09-11 DIAGNOSIS — T781XXD Other adverse food reactions, not elsewhere classified, subsequent encounter: Secondary | ICD-10-CM

## 2016-09-11 LAB — CBC WITH DIFFERENTIAL/PLATELET
Basophils Absolute: 88 cells/uL (ref 0–200)
Basophils Relative: 1 %
EOS ABS: 176 {cells}/uL (ref 15–500)
Eosinophils Relative: 2 %
HEMATOCRIT: 50.3 % — AB (ref 38.5–50.0)
HEMOGLOBIN: 16.7 g/dL (ref 13.2–17.1)
LYMPHS ABS: 2552 {cells}/uL (ref 850–3900)
LYMPHS PCT: 29 %
MCH: 30.7 pg (ref 27.0–33.0)
MCHC: 33.2 g/dL (ref 32.0–36.0)
MCV: 92.5 fL (ref 80.0–100.0)
MONO ABS: 792 {cells}/uL (ref 200–950)
MPV: 9.5 fL (ref 7.5–12.5)
Monocytes Relative: 9 %
NEUTROS PCT: 59 %
Neutro Abs: 5192 cells/uL (ref 1500–7800)
Platelets: 325 10*3/uL (ref 140–400)
RBC: 5.44 MIL/uL (ref 4.20–5.80)
RDW: 13 % (ref 11.0–15.0)
WBC: 8.8 10*3/uL (ref 3.8–10.8)

## 2016-09-11 NOTE — Patient Instructions (Addendum)
1. Chronic urticaria - Testing was positive to hickory pollen and pecan pollen, but we could not interpret this since your positive controls were not positive. - We will get blood testing to environmental allergens to look for allergic triggers. - We will also get lab work to look for serious causes of hives.  - Continue with Xyzal as needed to help control outbreaks. - If you become tired of taking the Xyzal or you are having outbreaks despite the Xyzal, we can consider adding a monthly injectable medication called Xolair.   2. Adverse food reaction - Testing to seafood was negative, but again we could not interpret this because the positive controls were not positive. - We will get testing to make sure that it was negative. - No EpiPen for now.   3. Return in about 6 months (around 03/12/2017).  Please inform us of any Emergency Department visits, hospitalizations, or changes in symptoms. Call us before going to the ED for breathing or allergy symptoms since we might be able to fit you in for a sick visit. Feel free to contact us anytime with any questions, problems, or concerns.  It was a pleasure to meet you today!   Websites that have reliable patient information: 1. American Academy of Asthma, Allergy, and Immunology: www.aaaai.org 2. Food Allergy Research and Education (FARE): foodallergy.org 3. Mothers of Asthmatics: http://www.asthmacommunitynetwork.org 4. American College of Allergy, Asthma, and Immunology: www.acaai.org

## 2016-09-11 NOTE — Progress Notes (Signed)
NEW PATIENT  Date of Service/Encounter:  09/12/16   Assessment:   Chronic urticaria - Plan: Allergy Test, CP Chronic Urticaria index panel, Tryptase, CBC with Differential/Platelet, Allergy-Shellfish Panel, Allergen Zone 3  Adverse food reaction, subsequent encounter - Plan: Allergy Test, Allergy-Shellfish Panel     Plan/Recommendations:   1. Chronic urticaria - Testing was positive to hickory pollen and pecan pollen, but we could not interpret this since your positive controls were not positive. - We will get blood testing to environmental allergens to look for allergic triggers. - We will also get lab work to look for serious causes of hives.  - Continue with Xyzal as needed to help control outbreaks. - If you become tired of taking the Xyzal or you are having outbreaks despite the Xyzal, we can consider adding a monthly injectable medication called Xolair.   2. Adverse food reaction - Testing to seafood was negative, but again we could not interpret this because the positive controls were not positive. - We will get testing to make sure that it was negative. - No EpiPen for now since the history was not very convincing.  3. Return in about 6 months (around 03/12/2017).   Subjective:   Alanmichael Barmore is a 40 y.o. male presenting today for evaluation of  Chief Complaint  Patient presents with  . Urticaria    Went to [facility] 09/07/16 for facial swelling and sore throat.  Has seen Dr. Donneta Romberg in past and allergic to pecan pollen  .  Clayborn Nguyen has a history of the following: Patient Active Problem List   Diagnosis Date Noted  . Chronic urticaria 09/12/2016  . Unspecified constipation 11/04/2013    History obtained from: chart review and patient.  Oluwadarasimi Auker was referred by Ann Held, DO.     Dezmond is a 40 y.o. male presenting for urticaria. These have been going on for three years. They occur around 1 week at the most. He is on Xyzal which was started  three years ago. He takes it only as needed. It causes sleepiness so he tries to avoid it. Hives respond well to the Xyzal. He is on Zyrtec which he takes only as needed in addition to the Xyzal. They do leave a rash when it resolves. They are red and raised. He does not have concurrent fevers or joint pains. He does have facial swelling occasionally with these. The swelling started relatively recently. He was seen in the ED in October 2017 for this and was given prednisone, Pepcid, and another medication.   There were no exposures when this occurred. It is not associated with food, although there was an event when he had a swelling from shrimp when he was eating sushi. He stayed away from shrimp since that time. Otherwise there do not seem to be any kind of food triggers.   He did have a problem with poison ivy when he was a kiddo. This has resolved over the last ten years. Currently he is just avoiding poison ivy. Otherwise, there is no history of other atopic diseases, including asthma, drug allergies, stinging insect allergies, or urticaria. There is no significant infectious history. Vaccinations are probably not up to date according to the patient.    Past Medical History: Patient Active Problem List   Diagnosis Date Noted  . Chronic urticaria 09/12/2016  . Unspecified constipation 11/04/2013    Medication List:    Medication List       Accurate as of 09/11/16  11:59 PM. Always use your most recent med list.          cetirizine 10 MG tablet Commonly known as:  ZYRTEC Take 10 mg by mouth daily.   famotidine 40 MG tablet Commonly known as:  PEPCID Take 1 tablet (40 mg total) by mouth daily.   hydrOXYzine 25 MG tablet Commonly known as:  ATARAX/VISTARIL Take 1 tablet (25 mg total) by mouth every 6 (six) hours. Prn hives   levocetirizine 5 MG tablet Commonly known as:  XYZAL Take 5 mg by mouth every evening.   linaclotide 145 MCG Caps capsule Commonly known as:   LINZESS Take 1 capsule (145 mcg total) by mouth daily.       Birth History: non-contributory. Born at term without complications. He was born in [place].   Developmental History: Desmond has met all milestones on time. He has required no speech therapy, occupational therapy, or physical therapy.   Past Surgical History: Past Surgical History:  Procedure Laterality Date  . NO PAST SURGERIES       Family History: Family History  Problem Relation Age of Onset  . Stomach cancer Paternal Grandmother   . Thyroid disease Mother     Removal of the Thyroid  . Allergic rhinitis Neg Hx   . Angioedema Neg Hx   . Asthma Neg Hx   . Eczema Neg Hx   . Immunodeficiency Neg Hx   . Urticaria Neg Hx      Social History: Dace lives at home with his girlfriend. He does not have children. Patient lives in a house that was built in the 1970s. There is tile in the family living areas and carpeting in the bedroom. He has electric heating and central cooling. He was an outside dog and cats. He does not use dust mite covers on his bedding her pillows. He smokes one pack per day. 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    Review of Systems: a 14-point review of systems is pertinent for what is mentioned in HPI.  Otherwise, all other systems were negative. Constitutional: negative other than that listed in the HPI Eyes: negative other than that listed in the HPI Ears, nose, mouth, throat, and face: negative other than that listed in the HPI Respiratory: negative other than that listed in the HPI Cardiovascular: negative other than that listed in the HPI Gastrointestinal: negative other than that listed in the HPI Genitourinary: negative other than that listed in the HPI Integument: negative other than that listed in the HPI Hematologic: negative other than that listed in the HPI Musculoskeletal: negative other than that listed in the HPI Neurological: negative other  than that listed in the HPI Allergy/Immunologic: negative other than that listed in the HPI    Objective:   Blood pressure 122/70, pulse 80, temperature 98.3 F (36.8 C), temperature source Oral, resp. rate 16, height 5' 4.57" (1.64 m), weight 160 lb 11.5 oz (72.9 kg), SpO2 97 %. Body mass index is 27.1 kg/m.   Physical Exam:  General: Alert, interactive, in no acute distress. Cooperative with the exam. HEENT: TMs pearly gray, turbinates edematous with clear discharge, post-pharynx mildly erythematous. Geographical tongue. Neck: Supple without thyromegaly. Adenopathy: no enlarged lymph nodes appreciated in the anterior cervical, occipital, axillary, epitrochlear, inguinal, or popliteal regions Lungs: Clear to auscultation without wheezing, rhonchi or rales. No increased work of breathing. CV: Normal S1/S2, no murmurs. Capillary refill <2 seconds.  Abdomen: Nondistended, nontender. No  guarding or rebound tenderness. Bowel sounds hyperactive  Skin: Warm and dry, without lesions or rashes. Extremities:  No clubbing, cyanosis or edema. Neuro:   Grossly intact.  Diagnostic studies:   Allergy Studies:   Indoor/Outdoor Percutaneous Adult Environmental Panel: positive to hickory pollen and pecan pollen, however the histamine controls were both negative negating any interpretation of the data  Selected Foods Panel: negative to shellfish and fin fish, however the histamine controls were both negative negating any interpretation of the data    Salvatore Marvel, MD [facility]

## 2016-09-12 ENCOUNTER — Encounter: Payer: Self-pay | Admitting: Allergy & Immunology

## 2016-09-12 DIAGNOSIS — L508 Other urticaria: Secondary | ICD-10-CM

## 2016-09-12 HISTORY — DX: Other urticaria: L50.8

## 2016-09-12 LAB — CP584 ZONE 3
ALLERGEN, A. ALTERNATA, M6: 0.11 kU/L — AB
ALLERGEN, C. HERBARUM, M2: 0.12 kU/L — AB
ALLERGEN, CEDAR TREE, T6: 0.19 kU/L — AB
ALLERGEN, COMM SILVER BIRCH, T3: 0.14 kU/L — AB
ALLERGEN, D PTERNOYSSINUS, D1: 0.14 kU/L — AB
ALLERGEN, MUCOR RACEMOSUS, M4: 0.19 kU/L — AB
Allergen, Mulberry, t76: 0.1 kU/L — ABNORMAL HIGH
Allergen, Oak,t7: 0.16 kU/L — ABNORMAL HIGH
Allergen, S. Botryosum, m10: <0.1 kU/L
Aspergillus fumigatus, m3: <0.1 kU/L
BERMUDA GRASS: 0.17 kU/L — AB
Bahia Grass: 0.2 kU/L — ABNORMAL HIGH
Box Elder IgE: 0.17 kU/L — ABNORMAL HIGH
CAT DANDER: 0.11 kU/L — AB
Cockroach: 0.19 kU/L — ABNORMAL HIGH
Common Ragweed: 0.13 kU/L — ABNORMAL HIGH
D. FARINAE: 0.11 kU/L — AB
Dog Dander: 0.13 kU/L — ABNORMAL HIGH
ELM IGE: 0.21 kU/L — AB
JOHNSON GRASS: 0.17 kU/L — AB
MEADOW GRASS: 0.22 kU/L — AB
Nettle: 0.23 kU/L — ABNORMAL HIGH
PECAN/HICKORY TREE IGE: 0.98 kU/L — AB
PLANTAIN: 0.17 kU/L — AB
Rough Pigweed  IgE: 0.13 kU/L — ABNORMAL HIGH

## 2016-09-12 LAB — ALLERGY-SHELLFISH PANEL
CRAB: 0.13 kU/L — AB
Clams: 0.18 kU/L — ABNORMAL HIGH
LOBSTER: 0.16 kU/L — AB
Shrimp IgE: 0.15 kU/L — ABNORMAL HIGH

## 2016-09-12 LAB — TRYPTASE: Tryptase: 3.8 ug/L (ref <11)

## 2016-09-14 ENCOUNTER — Telehealth: Payer: Self-pay | Admitting: Allergy & Immunology

## 2016-09-14 NOTE — Telephone Encounter (Signed)
Advised pt to take an extra Xyzal. Pt states he only has a little bump(smaller than a hive) that it itching.

## 2016-09-14 NOTE — Telephone Encounter (Signed)
Please advise 

## 2016-09-14 NOTE — Telephone Encounter (Signed)
There is no reason to be alarmed. I would recommend taking an extra Xyzal for the next few days to suppress. Are there any other systemic symptoms?   Malachi BondsJoel Jaye Saal, MD FAAAAI [facility]

## 2016-09-14 NOTE — Telephone Encounter (Signed)
He was in for allergy testing on Monday. He has had 2 bumps come up one on each side of his back  Where the testing was done. Does he need to be concerned with this. He says they are itchy. Please advise.

## 2016-09-18 LAB — CP CHRONIC URTICARIA INDEX PANEL
Histamine Release: <16 % (ref <16)
THYROID PEROXIDASE ANTIBODY: 1 [IU]/mL (ref <9)
TSH: 2.78 m[IU]/L (ref 0.40–4.50)

## 2017-03-12 ENCOUNTER — Ambulatory Visit: Payer: BLUE CROSS/BLUE SHIELD | Admitting: Allergy & Immunology

## 2023-01-12 ENCOUNTER — Ambulatory Visit (INDEPENDENT_AMBULATORY_CARE_PROVIDER_SITE_OTHER): Payer: 59 | Admitting: Medical

## 2023-01-12 VITALS — BP 110/60 | HR 76 | Ht 66.5 in | Wt 182.0 lb

## 2023-01-12 DIAGNOSIS — R5383 Other fatigue: Secondary | ICD-10-CM | POA: Diagnosis not present

## 2023-01-12 DIAGNOSIS — Z1159 Encounter for screening for other viral diseases: Secondary | ICD-10-CM | POA: Diagnosis not present

## 2023-01-12 DIAGNOSIS — Z87891 Personal history of nicotine dependence: Secondary | ICD-10-CM | POA: Diagnosis not present

## 2023-01-12 DIAGNOSIS — Z111 Encounter for screening for respiratory tuberculosis: Secondary | ICD-10-CM

## 2023-01-12 DIAGNOSIS — Z1211 Encounter for screening for malignant neoplasm of colon: Secondary | ICD-10-CM

## 2023-01-12 DIAGNOSIS — L509 Urticaria, unspecified: Secondary | ICD-10-CM | POA: Insufficient documentation

## 2023-01-12 DIAGNOSIS — R0683 Snoring: Secondary | ICD-10-CM | POA: Diagnosis not present

## 2023-01-12 DIAGNOSIS — R400 Somnolence: Secondary | ICD-10-CM

## 2023-01-12 NOTE — Progress Notes (Signed)
Faxed sleep study order to [facility]

## 2023-01-12 NOTE — Progress Notes (Signed)
Subjective:  Ricardo Gibbs is a 47 y.o. male who presents for Chief Complaint  Patient presents with   get established    Get established. And will break out in little bumps and wille get worse. Seen allergist in the past and no one can figure out what is going on. Has snoring issues     Here as a new patient to establish care.  He notes a long history of urticaria rash at least 5 to 6 years of this.  It comes and goes on a fairly regular basis.  He takes allergy pill and it calms down momentarily but this is continue to be a recurrent problem for years.  He has seen an allergist and had a bunch of allergy testing.  He is also seeing a dermatologist twice and nobody really knows what is causing this.  When he had allergy testing the only significant allergy was Geanie [place]  He is a former smoker just quit 2 months ago but has smoked for the last 20+ years, about half pack per day.  He does drink a fair amount of alcohol at least 2 beers a day.  He used to be 4-5 beers a day.  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  He exercises some.  He had a well visit last year in May and had lab results which she has on his MyChart on his phone to review today.    Labs from May 2023:  Cbc nl, Comprehensive metabolic panel with glucose 102, lipid panel with total cholesterol 229, triglycerides 220, HDL 49, LDL 140, hemoglobin A1c 5.6%, testosterone 570, PSA normal 0.9  His girlfriend says he snores really loud and he does note daytime somnolence and fatigue.  Wants a sleep study  No other aggravating or relieving factors.    No other c/o.  Past Medical History:  Diagnosis Date   Chronic urticaria 09/12/2016   Environmental allergies    Pecan Pollen   GERD (gastroesophageal reflux disease)    Hx of angioedema    Rash    Current Outpatient Medications on File Prior to Visit  Medication Sig Dispense Refill   levocetirizine (XYZAL) 5 MG tablet Take 5 mg by mouth every  evening.     No current facility-administered medications on file prior to visit.    The following portions of the patient's history were reviewed and updated as appropriate: allergies, current medications, past family history, past medical history, past social history, past surgical history and problem list.  ROS Otherwise as in subjective above  Objective: BP 110/60   Pulse 76   Ht 5' 6.5" (1.689 m)   Wt 182 lb (82.6 kg)   BMI 28.94 kg/m   General appearance: alert, no distress, well developed, well nourished HEENT: normocephalic, sclerae anicteric, conjunctiva pink and moist, TMs pearly, nares patent, no discharge or erythema, pharynx normal Oral cavity: MMM, no lesions Neck: supple, no lymphadenopathy, no thyromegaly, no masses Heart: RRR, normal S1, S2, no murmurs Lungs: CTA bilaterally, no wheezes, rhonchi, or rales Pulses: 2+ radial pulses, 2+ pedal pulses, normal cap refill Ext: no edema   Assessment: Encounter Diagnoses  Name Primary?   Urticaria Yes   Snoring    Daytime somnolence    Other fatigue    Screening for tuberculosis    Screen for colon cancer    Need for hepatitis C screening test    Former smoker      Plan: We discussed his concerns.  We discussed that urticaria sometimes is idiopathic.  He is already seen dermatology and allergist and no specific cause has been found.  We discussed other things can cause urticaria including cancer, inflammation chronically, infection or other.  I recommend updating cancer screenings, additional labs as recommended below.  He wants to check insurance for coverage on this first.  Given his snoring and symptoms of sleep apnea we will go ahead and refer for home sleep study   Patient Instructions   Encounter Diagnoses  Name Primary?   Urticaria Yes   Snoring    Daytime somnolence    Other fatigue    Screening for tuberculosis    Screen for colon cancer    Need for hepatitis C screening test      Recommendations: I recommend you call your insurance company to inquire about co-pays and deductibles and cost for the following recommended test  You are due for colon cancer screening with a colonoscopy.  Call and ask your insurance company about coverage for screening colonoscopy   I recommend you have a CT Chest to screen for lung cancer given your 20+ year history of smoking    Given your chronic urticaria I recommend the following blood test which would be more of a diagnostic labs QuantiFERON gold blood test for tuberculosis screen HIV blood test Hepatitis B surface antigen and hepatitis C surface antibody blood test ESR sed rate marker for inflammation CRP marker for inflammation ANA autoimmune marker  Also check about your insurance coverage for routine screening labs for a well visit or physical visit     Snoring, daytime somnolence, fatigue We are referring you to [facility] for home sleep study Expect a phone call from them in the next few days We will follow-up after your results   [facility] was seen today for get established.  Diagnoses and all orders for this visit:  Urticaria  Snoring  Daytime somnolence  Other fatigue  Screening for tuberculosis  Screen for colon cancer  Need for hepatitis C screening test  Former smoker    Follow up: soon for well visit

## 2023-01-12 NOTE — Patient Instructions (Signed)
Encounter Diagnoses  Name Primary?   Urticaria Yes   Snoring    Daytime somnolence    Other fatigue    Screening for tuberculosis    Screen for colon cancer    Need for hepatitis C screening test     Recommendations: I recommend you call your insurance company to inquire about co-pays and deductibles and cost for the following recommended test  You are due for colon cancer screening with a colonoscopy.  Call and ask your insurance company about coverage for screening colonoscopy   I recommend you have a CT Chest to screen for lung cancer given your 20+ year history of smoking    Given your chronic urticaria I recommend the following blood test which would be more of a diagnostic labs QuantiFERON gold blood test for tuberculosis screen HIV blood test Hepatitis B surface antigen and hepatitis C surface antibody blood test ESR sed rate marker for inflammation CRP marker for inflammation ANA autoimmune marker  Also check about your insurance coverage for routine screening labs for a well visit or physical visit     Snoring, daytime somnolence, fatigue We are referring you to [facility] for home sleep study Expect a phone call from them in the next few days We will follow-up after your results

## 2023-02-06 ENCOUNTER — Telehealth: Payer: Self-pay | Admitting: Medical

## 2023-02-06 NOTE — Telephone Encounter (Signed)
Schedule visit soon to discuss sleep study that was abnormal

## 2023-02-08 ENCOUNTER — Encounter: Payer: Self-pay | Admitting: Medical

## 2023-02-08 ENCOUNTER — Ambulatory Visit (INDEPENDENT_AMBULATORY_CARE_PROVIDER_SITE_OTHER): Payer: 59 | Admitting: Medical

## 2023-02-08 VITALS — BP 110/70 | HR 91 | Ht 67.0 in | Wt 181.2 lb

## 2023-02-08 DIAGNOSIS — L508 Other urticaria: Secondary | ICD-10-CM

## 2023-02-08 DIAGNOSIS — Z136 Encounter for screening for cardiovascular disorders: Secondary | ICD-10-CM | POA: Insufficient documentation

## 2023-02-08 DIAGNOSIS — Z1211 Encounter for screening for malignant neoplasm of colon: Secondary | ICD-10-CM

## 2023-02-08 DIAGNOSIS — Z0000 Encounter for general adult medical examination without abnormal findings: Secondary | ICD-10-CM | POA: Diagnosis not present

## 2023-02-08 DIAGNOSIS — R0683 Snoring: Secondary | ICD-10-CM | POA: Diagnosis not present

## 2023-02-08 DIAGNOSIS — Z1159 Encounter for screening for other viral diseases: Secondary | ICD-10-CM

## 2023-02-08 DIAGNOSIS — Z113 Encounter for screening for infections with a predominantly sexual mode of transmission: Secondary | ICD-10-CM | POA: Diagnosis not present

## 2023-02-08 DIAGNOSIS — Z13220 Encounter for screening for lipoid disorders: Secondary | ICD-10-CM | POA: Diagnosis not present

## 2023-02-08 DIAGNOSIS — Z111 Encounter for screening for respiratory tuberculosis: Secondary | ICD-10-CM | POA: Diagnosis not present

## 2023-02-08 DIAGNOSIS — Z87891 Personal history of nicotine dependence: Secondary | ICD-10-CM

## 2023-02-08 DIAGNOSIS — K5909 Other constipation: Secondary | ICD-10-CM | POA: Diagnosis not present

## 2023-02-08 DIAGNOSIS — R5383 Other fatigue: Secondary | ICD-10-CM

## 2023-02-08 LAB — CBC WITH DIFFERENTIAL/PLATELET
EOS (ABSOLUTE): 0.2 10*3/uL (ref 0.0–0.4)
MCH: 30.6 pg (ref 26.6–33.0)
Neutrophils Absolute: 3 10*3/uL (ref 1.4–7.0)
Neutrophils: 51 %
RBC: 5.76 x10E6/uL (ref 4.14–5.80)
WBC: 6 10*3/uL (ref 3.4–10.8)

## 2023-02-08 LAB — LIPID PANEL

## 2023-02-08 LAB — COMPREHENSIVE METABOLIC PANEL

## 2023-02-08 LAB — TSH

## 2023-02-08 LAB — SEDIMENTATION RATE

## 2023-02-08 LAB — VITAMIN D 25 HYDROXY (VIT D DEFICIENCY, FRACTURES)

## 2023-02-08 LAB — HIV ANTIBODY (ROUTINE TESTING W REFLEX)

## 2023-02-08 NOTE — Progress Notes (Signed)
Subjective:   HPI  Ricardo Gibbs is a 47 y.o. male who presents for Chief Complaint  Patient presents with   fasting cpe    Fasting cpe, no concerns    Patient Care Team: Malichi Palardy, Camelia Eng, PA-C as PCP - General (Family Medicine)   Concerns: Here for well visit.  I saw him recently as a new patient.  At that time we referred him for sleep study.  He completed the study but has not heard back on results.  He has not heard back from the elevated test we sent him for.  He has a history of chronic urticaria and we had planned to do additional labs today in addition to screening labs given his history of urticaria unexplained.  Fortunately he has not had any urticaria since his last visit here   Reviewed their medical, surgical, family, social, medication, and allergy history and updated chart as appropriate.  No Active Allergies  Past Medical History:  Diagnosis Date   Chronic urticaria 09/12/2016   Environmental allergies    Pecan Pollen   GERD (gastroesophageal reflux disease)    Hx of angioedema    Rash     Current Outpatient Medications on File Prior to Visit  Medication Sig Dispense Refill   levocetirizine (XYZAL) 5 MG tablet Take 5 mg by mouth every evening.     No current facility-administered medications on file prior to visit.      Current Outpatient Medications:    levocetirizine (XYZAL) 5 MG tablet, Take 5 mg by mouth every evening., Disp: , Rfl:   Family History  Problem Relation Age of Onset   Stomach cancer Paternal Grandmother    Thyroid disease Mother        Removal of the Thyroid   Allergic rhinitis Neg Hx    Angioedema Neg Hx    Asthma Neg Hx    Eczema Neg Hx    Immunodeficiency Neg Hx    Urticaria Neg Hx     Past Surgical History:  Procedure Laterality Date   NO PAST SURGERIES     Review of Systems  Constitutional:  Negative for chills, fever, malaise/fatigue and weight loss.  HENT:  Negative for congestion, ear pain, hearing loss, sore  throat and tinnitus.   Eyes:  Negative for blurred vision, pain and redness.  Respiratory:  Negative for cough, hemoptysis and shortness of breath.   Cardiovascular:  Negative for chest pain, palpitations, orthopnea, claudication and leg swelling.  Gastrointestinal:  Positive for constipation. Negative for abdominal pain, blood in stool, diarrhea, nausea and vomiting.  Genitourinary:  Negative for dysuria, flank pain, frequency, hematuria and urgency.  Musculoskeletal:  Negative for falls, joint pain and myalgias.  Skin:  Positive for rash. Negative for itching.  Neurological:  Negative for dizziness, tingling, speech change, weakness and headaches.  Endo/Heme/Allergies:  Negative for polydipsia. Does not bruise/bleed easily.  Psychiatric/Behavioral:  Negative for depression and memory loss. The patient is not nervous/anxious and does not have insomnia.       Objective:  BP 110/70   Pulse 91   Ht 5\' 7"  (1.702 m)   Wt 181 lb 3.2 oz (82.2 kg)   BMI 28.38 kg/m   General appearance: alert, no distress, WD/WN, Asian male Skin: Scattered macules.  Right lower abdomen and right upper inner thigh with 2 individual oval darker approximately 3 mm x 2 mm flat macule that he says is unchanged, birthmark left lower back at approximately 2 cm x 1 cm HEENT: normocephalic,  conjunctiva/corneas normal, sclerae anicteric, PERRLA, EOMi, nares patent, no discharge or erythema, pharynx normal Oral cavity: MMM, tongue normal, teeth normal Neck: supple, no lymphadenopathy, no thyromegaly, no masses, normal ROM, no bruits Chest: non tender, normal shape and expansion Heart: RRR, normal S1, S2, no murmurs Lungs: CTA bilaterally, no wheezes, rhonchi, or rales Abdomen: +bs, soft, non tender, non distended, no masses, no hepatomegaly, no splenomegaly, no bruits Back: non tender, normal ROM, no scoliosis Musculoskeletal: upper extremities non tender, no obvious deformity, normal ROM throughout, lower extremities  non tender, no obvious deformity, normal ROM throughout Extremities: no edema, no cyanosis, no clubbing Pulses: 2+ symmetric, upper and lower extremities, normal cap refill Neurological: alert, oriented x 3, CN2-12 intact, strength normal upper extremities and lower extremities, sensation normal throughout, DTRs 2+ throughout, no cerebellar signs, gait normal Psychiatric: normal affect, behavior normal, pleasant  GU: normal male external genitalia,circumcised, nontender, no masses, no hernia, no lymphadenopathy Rectal: Deferred   Assessment and Plan :   Encounter Diagnoses  Name Primary?   Encounter for health maintenance examination in adult Yes   Chronic urticaria    Former smoker    Need for hepatitis C screening test    Other fatigue    Screen for colon cancer    Screening for tuberculosis    Snoring    Chronic constipation    Screening for heart disease    Screen for STD (sexually transmitted disease)    Screening for lipid disorders     This visit was a preventative care visit, also known as wellness visit or routine physical.   Topics typically include healthy lifestyle, diet, exercise, preventative care, vaccinations, sick and well care, proper use of emergency dept and after hours care, as well as other concerns.     Separate significant issues discussed: He had requested labs in general to evaluate for chronic urticaria.  We are also evaluating for cancer screening today and general labs screening.    General Recommendations: Continue to return yearly for your annual wellness and preventative care visits.  This gives [place] a chance to discuss healthy lifestyle, exercise, vaccinations, review your chart record, and perform screenings where appropriate.  I recommend you see your eye doctor yearly for routine vision care.  I recommend you see your dentist yearly for routine dental care including hygiene visits twice yearly.   Vaccination  Immunization History   Administered Date(s) Administered   Tdap 04/20/2022    Screening for cancer: Colon cancer screening: We will refer you for screening colonoscopy   Testicular cancer screening You should do a monthly self testicular exam if you are between 64-72 years old, and we typically do a testicular exam on the yearly physical for this same age group.   Prostate Cancer screening: The recommended prostate cancer screening test is a blood test called the prostate-specific antigen (PSA) test. PSA is a protein that is made in the prostate. As you age, your prostate naturally produces more PSA. Abnormally high PSA levels may be caused by: Prostate cancer. An enlarged prostate that is not caused by cancer (benign prostatic hyperplasia, or BPH). This condition is very common in older men. A prostate gland infection (prostatitis) or urinary tract infection. Certain medicines such as male hormones (like testosterone) or other medicines that raise testosterone levels. A rectal exam may be done as part of prostate cancer screening to help provide information about the size of your prostate gland. When a rectal exam is performed, it should be done after the  PSA level is drawn to avoid any effect on the results.   Skin cancer screening: Check your skin regularly for new changes, growing lesions, or other lesions of concern Come in for evaluation if you have skin lesions of concern.   Lung cancer screening: If you have a greater than 20 pack year history of tobacco use, then you may qualify for lung cancer screening with a chest CT scan.   Please call your insurance company to inquire about coverage for this test.   Pancreatic cancer:  no current screening test is available or routinely recommended. (risk factors: smoking, overweight or obese, diabetes, chronic pancreatitis, work exposure - dry cleaning, metal working, 45yo>, M>F, Sales promotion account executive, family hx/o, hereditary breast, ovarian, melanoma, lynch,  peutz-jeghers).  Symptoms: jaundice, dark urine, light color or greasy stools, itchy skin, belly or back pain, weight loss, poor appetite, nause, vomiting, liver enlargement, DVT/blood clots.   We currently don't have screenings for other cancers besides breast, cervical, colon, and lung cancers.  If you have a strong family history of cancer or have other cancer screening concerns, please let me know.  Genetic testing referral is an option for individuals with high cancer risk in the family.  There are some other cancer screenings in development currently.   Bone health: Get at least 150 minutes of aerobic exercise weekly Get weight bearing exercise at least once weekly Bone density test:  A bone density test is an imaging test that uses a type of X-ray to measure the amount of calcium and other minerals in your bones. The test may be used to diagnose or screen you for a condition that causes weak or thin bones (osteoporosis), predict your risk for a broken bone (fracture), or determine how well your osteoporosis treatment is working. The bone density test is recommended for females 62 and older, or females or males [id number] if certain risk factors such as thyroid disease, long term use of steroids such as for asthma or rheumatological issues, vitamin D deficiency, estrogen deficiency, family history of osteoporosis, self or family history of fragility fracture in first degree relative.    Heart health: Get at least 150 minutes of aerobic exercise weekly Limit alcohol It is important to maintain a healthy blood pressure and healthy cholesterol numbers  Heart disease screening: Screening for heart disease includes screening for blood pressure, fasting lipids, glucose/diabetes screening, BMI height to weight ratio, reviewed of smoking status, physical activity, and diet.    Goals include blood pressure 120/80 or less, maintaining a healthy lipid/cholesterol profile, preventing diabetes or keeping  diabetes numbers under good control, not smoking or using tobacco products, exercising most days per week or at least 150 minutes per week of exercise, and eating healthy variety of fruits and vegetables, healthy oils, and avoiding unhealthy food choices like fried food, fast food, high sugar and high cholesterol foods.    Other tests may possibly include EKG test, CT coronary calcium score, echocardiogram, exercise treadmill stress test.     Referral for CT coronary calcium score   Vascular disease screening: For higher risk individuals including smokers, diabetics, patients with known heart disease or high blood pressure, kidney disease, and others, screening for vascular disease or atherosclerosis of the arteries is available.  Examples may include carotid ultrasound, abdominal aortic ultrasound, ABI blood flow screening in the legs, thoracic aorta screening.   Medical care options: I recommend you continue to seek care here first for routine care.  We try really hard to have  available appointments Monday through Friday daytime hours for sick visits, acute visits, and physicals.  Urgent care should be used for after hours and weekends for significant issues that cannot wait till the next day.  The emergency department should be used for significant potentially life-threatening emergencies.  The emergency department is expensive, can often have long wait times for less significant concerns, so try to utilize primary care, urgent care, or telemedicine when possible to avoid unnecessary trips to the emergency department.  Virtual visits and telemedicine have been introduced since the pandemic started in 2020, and can be convenient ways to receive medical care.  We offer virtual appointments as well to assist you in a variety of options to seek medical care.   Legal  Take the time to do a last will and testament, Advanced Directives including [place] and Living Will documents.   Don't leave your family with burdens that can be handled ahead of time.   Advanced Directives: I recommend you consider completing a [place] and Living Will.   These documents respect your wishes and help alleviate burdens on your loved ones if you were to become terminally ill or be in a position to need those documents enforced.    You can complete Advanced Directives yourself, have them notarized, then have copies made for our office, for you and for anybody you feel should have them in safe keeping.  Or, you can have an attorney prepare these documents.   If you haven't updated your Last Will and Testament in a while, it may be worthwhile having an attorney prepare these documents together and save on some costs.       Spiritual and Emotional Health Keeping a healthy spiritual life can help you better manage your physical health. Your spiritual life can help you to cope with any issues that may arise with your physical health.  Balance can keep [place] healthy and help [place] to recover.  If you are struggling with your spiritual health there are questions that you may want to ask yourself:  What makes me feel most complete? When do I feel most connected to the rest of the world? Where do I find the most inner strength? What am I doing when I feel whole?  Helpful tips: Being in nature. Some people feel very connected and at peace when they are walking outdoors or are outside. Helping others. Some feel the largest sense of wellbeing when they are of service to others. Being of service can take on many forms. It can be doing volunteer work, being kind to strangers, or offering a hand to a friend in need. Gratitude. Some people find they feel the most connected when they remain grateful. They may make lists of all the things they are grateful for or say a thank you out loud for all they have.    Emotional Health Are you in tune with your emotional health?  Check out this  link: http://www.bray.com/    Financial Health Make sure you use a budget for your personal finances Make sure you are insured against risks (health insurance, life insurance, auto insurance, etc) Save more, spend less Set financial goals If you need help in this area, good resources include counseling through [employer] or other community resources, have a meeting with a Emergency planning/management officer, and a good resource is the [employer] was seen today for fasting cpe.  Diagnoses and all  orders for this visit:  Encounter for health maintenance examination in adult -     Comprehensive metabolic panel -     CBC with Differential/Platelet -     Lipid panel -     TSH -     VITAMIN D 25 Hydroxy (Vit-D Deficiency, Fractures) -     HIV Antibody (routine testing w rflx) -     RPR -     Hepatitis C antibody -     Sedimentation rate -     CT CARDIAC SCORING (SELF PAY ONLY); Future -     Ambulatory referral to Gastroenterology -     Hepatitis B surface antigen -     Cancel: QuantiFERON-TB Gold Plus -     QuantiFERON-TB Gold Plus  Chronic urticaria -     Comprehensive metabolic panel -     CBC with Differential/Platelet -     TSH -     VITAMIN D 25 Hydroxy (Vit-D Deficiency, Fractures) -     HIV Antibody (routine testing w rflx) -     RPR -     Hepatitis C antibody -     Sedimentation rate -     Hepatitis B surface antigen -     Cancel: QuantiFERON-TB Gold Plus  Former smoker  Need for hepatitis C screening test  Other fatigue -     CBC with Differential/Platelet -     TSH -     VITAMIN D 25 Hydroxy (Vit-D Deficiency, Fractures)  Screen for colon cancer -     Ambulatory referral to Gastroenterology  Screening for tuberculosis -     Cancel: QuantiFERON-TB Gold Plus -     QuantiFERON-TB Gold Plus  Snoring  Chronic constipation -     TSH  Screening for heart disease -     CT CARDIAC SCORING (SELF PAY ONLY); Future  Screen for  STD (sexually transmitted disease) -     HIV Antibody (routine testing w rflx) -     RPR -     Hepatitis C antibody -     Hepatitis B surface antigen  Screening for lipid disorders -     Lipid panel    Follow-up pending labs, yearly for physical

## 2023-02-09 LAB — RPR: RPR Ser Ql: NONREACTIVE

## 2023-02-09 LAB — COMPREHENSIVE METABOLIC PANEL
ALT: 40 IU/L (ref 0–44)
AST: 23 IU/L (ref 0–40)
Albumin/Globulin Ratio: 2 (ref 1.2–2.2)
Alkaline Phosphatase: 102 IU/L (ref 44–121)
BUN/Creatinine Ratio: 15 (ref 9–20)
BUN: 17 mg/dL (ref 6–24)
Bilirubin Total: 0.4 mg/dL (ref 0.0–1.2)
Chloride: 100 mmol/L (ref 96–106)
Creatinine, Ser: 1.16 mg/dL (ref 0.76–1.27)
Sodium: 137 mmol/L (ref 134–144)
Total Protein: 7.2 g/dL (ref 6.0–8.5)

## 2023-02-09 LAB — CBC WITH DIFFERENTIAL/PLATELET
Basophils Absolute: 0.1 10*3/uL (ref 0.0–0.2)
Basos: 1 %
Eos: 3 %
Hematocrit: 53.3 % — ABNORMAL HIGH (ref 37.5–51.0)
Hemoglobin: 17.6 g/dL (ref 13.0–17.7)
Immature Grans (Abs): 0 10*3/uL (ref 0.0–0.1)
Immature Granulocytes: 1 %
Lymphocytes Absolute: 2.2 10*3/uL (ref 0.7–3.1)
Lymphs: 37 %
MCHC: 33 g/dL (ref 31.5–35.7)
MCV: 93 fL (ref 79–97)
Monocytes Absolute: 0.4 10*3/uL (ref 0.1–0.9)
Monocytes: 7 %
Platelets: 252 10*3/uL (ref 150–450)
RDW: 12.7 % (ref 11.6–15.4)

## 2023-02-09 LAB — LIPID PANEL
Chol/HDL Ratio: 4.4 ratio (ref 0.0–5.0)
Cholesterol, Total: 235 mg/dL — ABNORMAL HIGH (ref 100–199)
LDL Chol Calc (NIH): 150 mg/dL — ABNORMAL HIGH (ref 0–99)

## 2023-02-09 LAB — HEPATITIS B SURFACE ANTIGEN: Hepatitis B Surface Ag: NEGATIVE

## 2023-02-09 LAB — QUANTIFERON-TB GOLD PLUS

## 2023-02-09 LAB — HEPATITIS C ANTIBODY: Hep C Virus Ab: NONREACTIVE

## 2023-02-09 NOTE — Progress Notes (Signed)
Left message for [facility] to send sleep study

## 2023-02-12 NOTE — Progress Notes (Signed)
I have faxed over a cover sheet asking that they send results. I have called them 3 times and keep getting answer service and have heard nothing back

## 2023-02-17 LAB — QUANTIFERON-TB GOLD PLUS
QuantiFERON Nil Value: 0.07 IU/mL
QuantiFERON TB1 Ag Value: 0.06 IU/mL
QuantiFERON TB2 Ag Value: 0.06 IU/mL
QuantiFERON-TB Gold Plus: NEGATIVE

## 2023-02-21 ENCOUNTER — Encounter: Payer: Self-pay | Admitting: Internal Medicine

## 2023-02-21 NOTE — Progress Notes (Signed)
I am following up on his results since I have been out of town on vacation.  He hopefully got the message already about adding vitamin D since his vitamin D was low.  He can do 2000 units daily over-the-counter vitamin D  Tuberculosis screen is negative.  Thyroid lab is a little off.  We can recheck this in a month to see if it still showing a little off for back to normal.  Negative for HIV syphilis hepatitis B and C.  His sleep study came back abnormal showing moderate obstructive sleep apnea and he had oxygen level of at least 11 minutes during sleep.  Generally this would warrant some type of treatment.  I would recommend he start a trial of CPAP device which is a more common treatment to help with sleep apnea.  Avoid laying flat on his back and sleep.  If he wants to come in and discuss treatment options first before pursuing treatment that is okay too  However if he is agreeable to trial of CPAP therapy, make the referral and schedule I month follow-up on this

## 2023-02-23 ENCOUNTER — Encounter: Payer: Self-pay | Admitting: Medical

## 2023-02-23 ENCOUNTER — Ambulatory Visit (INDEPENDENT_AMBULATORY_CARE_PROVIDER_SITE_OTHER): Payer: 59 | Admitting: Medical

## 2023-02-23 VITALS — BP 130/88 | HR 77 | Ht 67.0 in | Wt 180.2 lb

## 2023-02-23 DIAGNOSIS — G4733 Obstructive sleep apnea (adult) (pediatric): Secondary | ICD-10-CM

## 2023-02-23 DIAGNOSIS — R7989 Other specified abnormal findings of blood chemistry: Secondary | ICD-10-CM | POA: Diagnosis not present

## 2023-02-23 DIAGNOSIS — E559 Vitamin D deficiency, unspecified: Secondary | ICD-10-CM | POA: Diagnosis not present

## 2023-02-23 DIAGNOSIS — L508 Other urticaria: Secondary | ICD-10-CM | POA: Diagnosis not present

## 2023-02-23 DIAGNOSIS — R0683 Snoring: Secondary | ICD-10-CM | POA: Diagnosis not present

## 2023-02-23 NOTE — Progress Notes (Signed)
Subjective:  Ricardo Gibbs is a 47 y.o. male who presents for Chief Complaint  Patient presents with   Consult    Had a rough time with the CPAP mask, made his face tingle. Looking for other options/alternatives.      Here for follow-up from recent physical visit.  At that time his vitamin D was low we had him start vitamin D which she is taking.  His TSH was slightly out of the normal range.  He has no concerns about this, no particular worrisome symptoms related to thyroid  He is here to follow-up on his recent sleep study.  He has loud snoring.  His wife complains about his snoring.  We had called about results but he did not like the idea of using a CPAP.  He notes when he did the test he had a hard time with the testing device over his face.  He did the sleep study for couple nights and then turned it in.  Here to discuss treatment options for sleep apnea.  No other aggravating or relieving factors.    No other c/o.  The following portions of the patient's history were reviewed and updated as appropriate: allergies, current medications, past family history, past medical history, past social history, past surgical history and problem list.  ROS Otherwise as in subjective above  Objective: BP 130/88   Pulse 77   Ht 5\' 7"  (1.702 m)   Wt 180 lb 3.2 oz (81.7 kg)   SpO2 98%   BMI 28.22 kg/m   General appearance: alert, no distress, well developed, well nourished    Assessment: Encounter Diagnoses  Name Primary?   Snoring Yes   OSA (obstructive sleep apnea)    Vitamin D deficiency    Abnormal thyroid blood test    Chronic urticaria      Plan: Snoring, OSA-we reviewed the results from his recent sleep study.  I suspect some of the oxygenation tests are not accurate given that he could not tolerate the device on his face during the test.  Nevertheless he does show sleep apnea mild to moderate.  He does not want to do a CPAP.  We discussed other remedies including losing weight,  raise the head of the bed, sleeping in a position other than supine, we discussed oral airway device, inspire device.  He is going to try to lose weight and talk to dentist about an oral airway device.  His dentist had already talked him about a device to help with teeth grinding.  Follow-up in a few months on this issue.  Vitamin D deficiency-continue supplement he just recently started.  Plan to repeat labs in 3 months  Abnormal thyroid test on recent lab-repeat in 3 months  Chronic urticaria-follow-up with allergist for further evaluation  [facility] was seen today for consult.  Diagnoses and all orders for this visit:  Snoring  OSA (obstructive sleep apnea)  Vitamin D deficiency  Abnormal thyroid blood test  Chronic urticaria    Follow up: 41mo

## 2023-02-27 ENCOUNTER — Ambulatory Visit (HOSPITAL_COMMUNITY)
Admission: RE | Admit: 2023-02-27 | Discharge: 2023-02-27 | Disposition: A | Discharge status: Home / Self Care | Payer: Self-pay | Source: Ambulatory Visit | Attending: Medical | Admitting: Medical

## 2023-02-27 DIAGNOSIS — Z0000 Encounter for general adult medical examination without abnormal findings: Secondary | ICD-10-CM

## 2023-02-27 DIAGNOSIS — Z136 Encounter for screening for cardiovascular disorders: Secondary | ICD-10-CM

## 2023-02-27 NOTE — Progress Notes (Signed)
CT coronary test thankfully shows no cholesterol buildup in his coronaries.  His cholesterol recently was high though.  I strongly recommend improving diet in terms of lipids, avoiding high cholesterol foods, limiting meat intake and dairy.  Avoiding processed foods particular things made with butter.  If he wants to take a low-dose cholesterol pill every other day as a means of heart disease prevention then let me know.  If not then work on dietary and lifestyle measures along with regular exercise

## 2023-03-30 ENCOUNTER — Ambulatory Visit (AMBULATORY_SURGERY_CENTER): Payer: 59 | Admitting: *Deleted

## 2023-03-30 VITALS — Ht 67.0 in | Wt 180.0 lb

## 2023-03-30 DIAGNOSIS — Z1211 Encounter for screening for malignant neoplasm of colon: Secondary | ICD-10-CM

## 2023-03-30 MED ORDER — NA SULFATE-K SULFATE-MG SULF 17.5-3.13-1.6 GM/177ML PO SOLN
1.0000 | Freq: Once | ORAL | 0 refills | Status: AC
Start: 2023-03-30 — End: 2023-03-30

## 2023-03-30 NOTE — Progress Notes (Signed)
No egg or soy allergy known to patient  No issues known to pt with past sedation with any surgeries or procedures Patient denies ever being told they had issues or difficulty with intubation  No FH of Malignant Hyperthermia Pt is not on diet pills Pt is not on  home 02  Pt is not on blood thinners  Pt denies issues with constipation,STATE'S "HAVE BOWEL MOVEMENT DAILY"  No A fib or A flutter Have any cardiac testing pending--NO Pt instructed to use Singlecare.com or GoodRx for a price reduction on prep    Patient's chart reviewed by Cathlyn Parsons CNRA prior to previsit and patient appropriate for the [facility].  Previsit completed and red dot placed by patient's name on their procedure day (on provider's schedule).     INDEPENDENT WITH MOBILITY

## 2023-04-20 ENCOUNTER — Encounter: Payer: Self-pay | Admitting: Internal Medicine

## 2023-04-21 HISTORY — PX: COLONOSCOPY: SHX174

## 2023-05-01 ENCOUNTER — Ambulatory Visit (AMBULATORY_SURGERY_CENTER): Payer: 59 | Admitting: Internal Medicine

## 2023-05-01 ENCOUNTER — Encounter: Payer: Self-pay | Admitting: Internal Medicine

## 2023-05-01 VITALS — BP 114/75 | HR 74 | Temp 98.4°F | Resp 18 | Ht 67.0 in | Wt 180.0 lb

## 2023-05-01 DIAGNOSIS — Z1211 Encounter for screening for malignant neoplasm of colon: Secondary | ICD-10-CM

## 2023-05-01 DIAGNOSIS — D120 Benign neoplasm of cecum: Secondary | ICD-10-CM

## 2023-05-01 DIAGNOSIS — E785 Hyperlipidemia, unspecified: Secondary | ICD-10-CM | POA: Diagnosis not present

## 2023-05-01 DIAGNOSIS — D122 Benign neoplasm of ascending colon: Secondary | ICD-10-CM

## 2023-05-01 DIAGNOSIS — G4730 Sleep apnea, unspecified: Secondary | ICD-10-CM | POA: Diagnosis not present

## 2023-05-01 DIAGNOSIS — K635 Polyp of colon: Secondary | ICD-10-CM

## 2023-05-01 MED ORDER — SODIUM CHLORIDE 0.9 % IV SOLN
500.0000 mL | Freq: Once | INTRAVENOUS | Status: DC
Start: 2023-05-01 — End: 2023-05-01

## 2023-05-01 NOTE — Op Note (Signed)
[facility] Patient Name: Ricardo Gibbs Procedure Date: 05/01/2023 8:47 AM MRN: [id number] Endoscopist: Beverley Fiedler , MD, [id number] Age: 47 Referring MD:  Date of Birth: [birth year] Gender: Male Account #: [id number] Procedure:                Colonoscopy Indications:              Screening for colorectal malignant neoplasm, This                            is the patient's first colonoscopy Medicines:                Monitored Anesthesia Care Procedure:                Pre-Anesthesia Assessment:                           - Prior to the procedure, a History and Physical                            was performed, and patient medications and                            allergies were reviewed. The patient's tolerance of                            previous anesthesia was also reviewed. The risks                            and benefits of the procedure and the sedation                            options and risks were discussed with the patient.                            All questions were answered, and informed consent                            was obtained. Prior Anticoagulants: The patient has                            taken no anticoagulant or antiplatelet agents. ASA                            Grade Assessment: II - A patient with mild systemic                            disease. After reviewing the risks and benefits,                            the patient was deemed in satisfactory condition to                            undergo the procedure.  After obtaining informed consent, the colonoscope                            was passed under direct vision. Throughout the                            procedure, the patient's blood pressure, pulse, and                            oxygen saturations were monitored continuously. The                            CF [id number] #[id number] was introduced through the anus                            and advanced to the cecum,  identified by                            appendiceal orifice and ileocecal valve. The                            colonoscopy was performed without difficulty. The                            patient tolerated the procedure well. The quality                            of the bowel preparation was good. The ileocecal                            valve, appendiceal orifice, and rectum were                            photographed. Scope In: 9:00:02 AM Scope Out: 9:14:32 AM Scope Withdrawal Time: 0 hours 10 minutes 37 seconds  Total Procedure Duration: 0 hours 14 minutes 30 seconds  Findings:                 The digital rectal exam was normal.                           A 3 mm polyp was found in the cecum. The polyp was                            sessile. The polyp was removed with a cold snare.                            Resection and retrieval were complete.                           A 5 mm polyp was found in the ascending colon. The                            polyp was sessile. The polyp was  removed with a                            cold snare. Resection and retrieval were complete.                           Internal hemorrhoids were found during                            retroflexion. The hemorrhoids were small. Complications:            No immediate complications. Estimated Blood Loss:     Estimated blood loss: none. Impression:               - One 3 mm polyp in the cecum, removed with a cold                            snare. Resected and retrieved.                           - One 5 mm polyp in the ascending colon, removed                            with a cold snare. Resected and retrieved.                           - Internal hemorrhoids. Recommendation:           - Patient has a contact number available for                            emergencies. The signs and symptoms of potential                            delayed complications were discussed with the                             patient. Return to normal activities tomorrow.                            Written discharge instructions were provided to the                            patient.                           - Resume previous diet.                           - Continue present medications.                           - Await pathology results.                           - Repeat colonoscopy is recommended. The  colonoscopy date will be determined after pathology                            results from today's exam become available for                            review. Beverley Fiedler, MD 05/01/2023 9:17:02 AM This report has been signed electronically.

## 2023-05-01 NOTE — Patient Instructions (Addendum)
Resume previous diet. Continue present medications. Await pathology results. Repeat colonoscopy is recommended. The colonoscopy date will be determined after pathology results from today's exam become available for  review.  YOU HAD AN ENDOSCOPIC PROCEDURE TODAY AT THE [facility]:   Refer to the procedure report that was given to you for any specific questions about what was found during the examination.  If the procedure report does not answer your questions, please call your gastroenterologist to clarify.  If you requested that your care partner not be given the details of your procedure findings, then the procedure report has been included in a sealed envelope for you to review at your convenience later.  YOU SHOULD EXPECT: Some feelings of bloating in the abdomen. Passage of more gas than usual.  Walking can help get rid of the air that was put into your GI tract during the procedure and reduce the bloating. If you had a lower endoscopy (such as a colonoscopy or flexible sigmoidoscopy) you may notice spotting of blood in your stool or on the toilet paper. If you underwent a bowel prep for your procedure, you may not have a normal bowel movement for a few days.  Please Note:  You might notice some irritation and congestion in your nose or some drainage.  This is from the oxygen used during your procedure.  There is no need for concern and it should clear up in a day or so.  SYMPTOMS TO REPORT IMMEDIATELY:  Following lower endoscopy (colonoscopy or flexible sigmoidoscopy):  Excessive amounts of blood in the stool  Significant tenderness or worsening of abdominal pains  Swelling of the abdomen that is new, acute  Fever of 100F or higher  For urgent or emergent issues, a gastroenterologist can be reached at any hour by calling [phone]. Do not use MyChart messaging for urgent concerns.    DIET:  We do recommend a small meal at first, but then you may proceed to your  regular diet.  Drink plenty of fluids but you should avoid alcoholic beverages for 24 hours.  ACTIVITY:  You should plan to take it easy for the rest of today and you should NOT DRIVE or use heavy machinery until tomorrow (because of the sedation medicines used during the test).    FOLLOW UP: Our staff will call the number listed on your records the next business day following your procedure.  We will call around 7:15- 8:00 am to check on you and address any questions or concerns that you may have regarding the information given to you following your procedure. If we do not reach you, we will leave a message.     If any biopsies were taken you will be contacted by phone or by letter within the next 1-3 weeks.  Please call us at [phone] if you have not heard about the biopsies in 3 weeks.    SIGNATURES/CONFIDENTIALITY: You and/or your care partner have signed paperwork which will be entered into your electronic medical record.  These signatures attest to the fact that that the information above on your After Visit Summary has been reviewed and is understood.  Full responsibility of the confidentiality of this discharge information lies with you and/or your care-partner.  

## 2023-05-01 NOTE — Progress Notes (Signed)
Sedate, gd SR, tolerated procedure well, VSS, report to RN 

## 2023-05-01 NOTE — Progress Notes (Signed)
GASTROENTEROLOGY PROCEDURE H&P NOTE   Primary Care Physician: Jac Canavan, PA-C    Reason for Procedure:   Crc screening  Plan:    colonoscopy  Patient is appropriate for endoscopic procedure(s) in the ambulatory ([facility]) setting.  The nature of the procedure, as well as the risks, benefits, and alternatives were carefully and thoroughly reviewed with the patient. Ample time for discussion and questions allowed. The patient understood, was satisfied, and agreed to proceed.     HPI: Ricardo Gibbs is a 47 y.o. male who presents for screening colonoscopy.  Medical history as below.  Tolerated the prep.  No recent chest pain or shortness of breath.  No abdominal pain today.  Past Medical History:  Diagnosis Date   Allergy    SEASONAL   Chronic urticaria 09/12/2016   Environmental allergies    Pecan Pollen   GERD (gastroesophageal reflux disease)    Hx of angioedema    Hyperlipidemia    Rash    Sleep apnea    NO CPAP    Past Surgical History:  Procedure Laterality Date   NO PAST SURGERIES      Prior to Admission medications   Medication Sig Start Date End Date Taking? Authorizing Provider  cetirizine (ZYRTEC) 10 MG tablet Take by mouth. 12/14/19  Yes [provider]  Cholecalciferol (VITAMIN D3) 50 MCG (2000 UT) CAPS Take 1 capsule by mouth daily.   Yes [provider]  levocetirizine (XYZAL) 5 MG tablet Take 5 mg by mouth every evening.   Yes [provider]  Multiple Vitamin (MULTIVITAMIN) capsule Take 1 capsule by mouth daily.   Yes [provider]  triamcinolone ointment (KENALOG) 0.1 % as needed.    [provider]    Current Outpatient Medications  Medication Sig Dispense Refill   cetirizine (ZYRTEC) 10 MG tablet Take by mouth.     Cholecalciferol (VITAMIN D3) 50 MCG (2000 UT) CAPS Take 1 capsule by mouth daily.     levocetirizine (XYZAL) 5 MG tablet Take 5 mg by mouth every evening.     Multiple Vitamin  (MULTIVITAMIN) capsule Take 1 capsule by mouth daily.     triamcinolone ointment (KENALOG) 0.1 % as needed.     Current Facility-Administered Medications  Medication Dose Route Frequency Provider Last Rate Last Admin   0.9 %  sodium chloride infusion  500 mL Intravenous Once Yeiden Frenkel, Carie Caddy, MD        Allergies as of 05/01/2023   (No Known Allergies)    Family History  Problem Relation Age of Onset   Colon polyps Mother    Thyroid disease Mother        Removal of the Thyroid   Colon polyps Father    Stomach cancer Paternal Grandmother    Allergic rhinitis Neg Hx    Angioedema Neg Hx    Asthma Neg Hx    Eczema Neg Hx    Immunodeficiency Neg Hx    Urticaria Neg Hx    Colon cancer Neg Hx    Crohn's disease Neg Hx    Esophageal cancer Neg Hx    Rectal cancer Neg Hx    Ulcerative colitis Neg Hx     Social History   Socioeconomic History   Marital status: Single    Spouse name: Not on file   Number of children: 3   Years of education: Not on file   Highest education level: Not on file  Occupational History   Occupation: self employed  Employer: [employer]  Tobacco Use   Smoking status: Former    Packs/day: .5    Types: Cigarettes    Start date: 12/13/1995    Quit date: 05/20/2022    Years since quitting: 0.9   Smokeless tobacco: Never  Vaping Use   Vaping Use: Never used  Substance and Sexual Activity   Alcohol use: Yes    Comment: occasional   Drug use: No   Sexual activity: Yes  Other Topics Concern   Not on file  Social History Narrative   Not on file   Social Determinants of Health   Financial Resource Strain: Not on file  Food Insecurity: Not on file  Transportation Needs: Not on file  Physical Activity: Not on file  Stress: Not on file  Social Connections: Not on file  Intimate Partner Violence: Not on file    Physical Exam: Vital signs in last 24 hours: @BP  123/81   Pulse 76   Temp 98.4 F (36.9 C)   Ht 5\' 7"  (1.702 m)   Wt 180 lb  (81.6 kg)   SpO2 100%   BMI 28.19 kg/m  GEN: NAD EYE: Sclerae anicteric ENT: MMM CV: Non-tachycardic Pulm: CTA b/l GI: Soft, NT/ND NEURO:  Alert & Oriented x 3   Erick Blinks, MD [facility]  05/01/2023 8:52 AM

## 2023-05-01 NOTE — Progress Notes (Signed)
Called to room to assist during endoscopic procedure.  Patient ID and intended procedure confirmed with present staff. Received instructions for my participation in the procedure from the performing physician.  

## 2023-05-02 ENCOUNTER — Telehealth: Payer: Self-pay

## 2023-05-02 NOTE — Telephone Encounter (Signed)
Left message on follow up call. 

## 2023-05-09 ENCOUNTER — Encounter: Payer: Self-pay | Admitting: Internal Medicine

## 2023-05-29 ENCOUNTER — Other Ambulatory Visit: Payer: 59

## 2023-10-11 ENCOUNTER — Encounter: Payer: Self-pay | Admitting: Family Medicine

## 2023-10-11 ENCOUNTER — Ambulatory Visit (INDEPENDENT_AMBULATORY_CARE_PROVIDER_SITE_OTHER): Payer: 59 | Admitting: Family Medicine

## 2023-10-11 VITALS — BP 114/74 | HR 68 | Temp 98.0°F | Ht 66.0 in | Wt 177.0 lb

## 2023-10-11 DIAGNOSIS — L731 Pseudofolliculitis barbae: Secondary | ICD-10-CM

## 2023-10-11 MED ORDER — DOXYCYCLINE HYCLATE 100 MG PO TABS
100.0000 mg | ORAL_TABLET | Freq: Two times a day (BID) | ORAL | 0 refills | Status: DC
Start: 2023-10-11 — End: 2024-02-11

## 2023-10-11 NOTE — Patient Instructions (Signed)
  Apply warm compresses frequently to the area on the chin. This might allow the area to open up and drain, and heal. Take the antibiotics twice daily for a week. Stay out of the sun or use sunscreen.

## 2023-10-11 NOTE — Progress Notes (Signed)
Chief Complaint  Patient presents with   Consult    Ingrown hair on face, also has a mole on his head that has been growing.  Has seen [facility] derm in the past, needs referral.     He has had a hard area on the right chin for years--periodically squeezes it, gets pus out, can't get the ingrown hair out. In the last few days he has been "messing with it more", and it is larger and more tender.  He also has something on the top of his head that is getting larger  No other skin concerns.   PMH, PSH, SH reviewed  Takes anithistamines for urticaria, not seasonal allergies (uses prn).  Outpatient Encounter Medications as of 10/11/2023  Medication Sig Note   cetirizine (ZYRTEC) 10 MG tablet Take by mouth.    Cholecalciferol (VITAMIN D3) 50 MCG (2000 UT) CAPS Take 1 capsule by mouth daily.    [DISCONTINUED] Multiple Vitamin (MULTIVITAMIN) capsule Take 1 capsule by mouth daily.    triamcinolone ointment (KENALOG) 0.1 % as needed. (Patient not taking: Reported on 10/11/2023) 10/11/2023: As needed for poison ivy   [DISCONTINUED] levocetirizine (XYZAL) 5 MG tablet Take 5 mg by mouth every evening. (Patient not taking: Reported on 10/11/2023) 02/23/2023: As needed   No facility-administered encounter medications on file as of 10/11/2023.   No Known Allergies  ROS: no f/c, n/v/d, URI symptoms, cough, shortness of breath. No chest pain. Denies rashes.  Infrequent hives (1-2x/month).     PHYSICAL EXAM:  BP 114/74   Pulse 68   Temp 98 F (36.7 C) (Tympanic)   Ht 5\' 6"  (1.676 m)   Wt 177 lb (80.3 kg)   BMI 28.57 kg/m   Well-appearing male in no distress HEENT: conjunctiva and sclera are clear, EOMI Top of head--SK vs nevus, light brown.  Benign appearing. Scalp normal, no flaking inflammation, no hair loss  R lower chin--there is an area of induration with black pore.  Mildly tender, mild erythema, but no erythema of surrounding area.  Some focal area of STS noted below the corner of  the right mouth--no visible pore here, no induration, just slight lump noted Neck: No cervical lymphadenopathy    ASSESSMENT/PLAN:  Ingrown hair - on right inferior chin.  Tender, slightly indurated, suspect early infection. Heat and ABX. Advised not to pluck/shave closely - Plan: doxycycline (VIBRA-TABS) 100 MG tablet  May have a very early lesion on R face as well. Warm compresses prn. Not to poke/puncture.  Reassurred regarding benign lesion on top of scalp.    Apply warm compresses frequently to the area on the chin. This might allow the area to open up and drain, and heal. Take the antibiotics twice daily for a week. Stay out of the sun or use sunscreen.

## 2023-11-13 ENCOUNTER — Ambulatory Visit (INDEPENDENT_AMBULATORY_CARE_PROVIDER_SITE_OTHER): Payer: 59 | Admitting: Medical

## 2023-11-13 VITALS — BP 120/72 | HR 88 | Temp 97.0°F | Ht 66.0 in | Wt 177.8 lb

## 2023-11-13 DIAGNOSIS — L0201 Cutaneous abscess of face: Secondary | ICD-10-CM

## 2023-11-13 MED ORDER — DOXYCYCLINE HYCLATE 100 MG PO TABS: 100.0000 mg | ORAL_TABLET | Freq: Two times a day (BID) | ORAL | 0 refills | Status: DC

## 2023-11-13 MED ORDER — IBUPROFEN 600 MG PO TABS: 600.0000 mg | ORAL_TABLET | Freq: Three times a day (TID) | ORAL | 0 refills | Status: AC | PRN

## 2023-11-13 NOTE — Patient Instructions (Signed)
You had an abscess of the right face today.  From what you are describing you have had a cyst there for a while but it got to the point where it got infected.  We did an incision and drainage today and drained approximately 1 to 2 cc of pus  Recommendations: Keep the wound clean with soap and water over the weekend When you are in the shower clean the area with soap and then let the water flush your face thoroughly It may sleep a little bit of pus over the next few days. You can use a Band-Aid over the area for the next 3 to 4 days until it closes up Begin doxycycline oral antibiotic.  You may only need this 5 days but I did send a whole weeks worth just in case Use the doxycycline antibiotic until you feel like there is no swelling or tension in the skin and it seems like it is healing up. Use ibuprofen 600 mg 2-3 times a day for the next several days for pain and inflammation The skin should gradually heal at this point now that the pus and pressure is released Plan to come back in for recheck in 1 week (no charge visit)

## 2023-11-13 NOTE — Progress Notes (Signed)
  Subjective:   Ricardo Gibbs is a 47 y.o. male who presents for evaluation of a probable cutaneous abscess. Lesion is located on the right face just below the jawline.  He notes that he had a little bump or cyst there for years but it got worse about a month ago.  He came and saw Dr. Lynelle Doctor who put him on antibiotic at that time doxycycline.  He got little better but then go away fully.  Over the last 3 to 4 days though it got more intense and swollen and now it feels like it has pressure in it and pain.  No fever, no bodyaches or chills.  No history of MRSA.  No history of diabetes.  Patient denies hx/o poor wound healing, compromised immunity or HIV.  No other aggravating or relieving factors.  No other c/o.  Past Medical History:  Diagnosis Date   Allergy    SEASONAL   Chronic urticaria 09/12/2016   Environmental allergies    Pecan Pollen   GERD (gastroesophageal reflux disease)    Hx of angioedema    Hyperlipidemia    Rash    Sleep apnea    NO CPAP    Reviewed prior allergies, medications, past medical history, past surgical history.  ROS as in subjective   Objective:   Vitals:   11/13/23 0810  BP: 120/72  Pulse: 88  Temp: (!) 97 F (36.1 C)   Gen: wd, wn, nad Skin: Right face just below the jawline is a 1.4 cm round raised indurated and fluctuant area with a central pore suggesting prior cyst.  There is no drainage.  There is mild pinkish coloration.  Somewhat tender   Assessment:   Encounter Diagnosis  Name Primary?   Cutaneous abscess of face Yes      Plan:   Discussed examination findings, diagnosis, usual course of illness, and options for therapy discussed. After discussing recommendations, patient agrees to I&D, oral antibiotics.    Procedure Informed consent obtained.  The area was prepped in the usual manner and the skin overlying the abscess was anesthetized with 1.5cc of 1% lidocaine with epinephrine.  The area was sharply incised and approx 2ccs of purulent  material was obtained.  Area was irrigated with high pressure saline. Packing was not inserted. Wound was covered with sterile bandage.    Advised patient to complete the course of oral antibiotics, use warm compresses or heat applied to the area to promote drainage.  Follow up: 1 week.  However, if worse signs of infections as discussed (fever, chills, nausea, vomiting, worsening redness, worsening pain), then call or return immediately.

## 2023-11-20 ENCOUNTER — Ambulatory Visit (INDEPENDENT_AMBULATORY_CARE_PROVIDER_SITE_OTHER): Payer: 59 | Admitting: Medical

## 2023-11-20 DIAGNOSIS — Z5189 Encounter for other specified aftercare: Secondary | ICD-10-CM

## 2023-11-20 NOTE — Progress Notes (Signed)
  Subjective:   Ricardo Gibbs is a 47 y.o. male who presents for wound check.  I saw him 11/13/23 for abscess of face under right jaw, I&D performed.  Today he notes slight drainage at times.  Finished antibiotc.  No other c/o.  Past Medical History:  Diagnosis Date   Allergy     SEASONAL   Chronic urticaria 09/12/2016   Environmental allergies    Pecan Pollen   GERD (gastroesophageal reflux disease)    Hx of angioedema    Hyperlipidemia    Rash    Sleep apnea    NO CPAP    Reviewed prior allergies, medications, past medical history, past surgical history.  ROS as in subjective   Objective:   There were no vitals filed for this visit.  Gen: wd, wn, nad Skin: Right face just below the jaw line is the improving abscess.  There is tiny drop of fluid from I&D site, but no erythema, induration, warmth of fluctuance.  Much improved   Assessment:   Encounter Diagnosis  Name Primary?   Wound check, abscess Yes       Plan:   Advised warm compresses twice daily the rest of this week.  Area should gradually heal.  If any new pain, swelling, redness or worsening this week, call back for additional round of doxycycline .  Otherwise skin should gradually heal.  F/u prn

## 2023-11-21 HISTORY — PX: CYST EXCISION: SHX5701

## 2023-12-04 ENCOUNTER — Other Ambulatory Visit: Payer: Self-pay | Admitting: Medical

## 2023-12-04 ENCOUNTER — Telehealth: Payer: Self-pay | Admitting: Medical

## 2023-12-04 DIAGNOSIS — L720 Epidermal cyst: Secondary | ICD-10-CM

## 2023-12-04 NOTE — Telephone Encounter (Signed)
 Pt called and states his bump has gotten bigger and he may need the referral to dermatology that you all discussed.

## 2023-12-10 DIAGNOSIS — L720 Epidermal cyst: Secondary | ICD-10-CM | POA: Diagnosis not present

## 2024-01-09 DIAGNOSIS — L720 Epidermal cyst: Secondary | ICD-10-CM | POA: Diagnosis not present

## 2024-01-15 DIAGNOSIS — Z789 Other specified health status: Secondary | ICD-10-CM | POA: Diagnosis not present

## 2024-01-15 DIAGNOSIS — L538 Other specified erythematous conditions: Secondary | ICD-10-CM | POA: Diagnosis not present

## 2024-01-15 DIAGNOSIS — R208 Other disturbances of skin sensation: Secondary | ICD-10-CM | POA: Diagnosis not present

## 2024-01-15 DIAGNOSIS — L720 Epidermal cyst: Secondary | ICD-10-CM | POA: Diagnosis not present

## 2024-01-15 DIAGNOSIS — D485 Neoplasm of uncertain behavior of skin: Secondary | ICD-10-CM | POA: Diagnosis not present

## 2024-02-08 ENCOUNTER — Encounter: Payer: 59 | Admitting: Medical

## 2024-02-11 ENCOUNTER — Encounter: Payer: Self-pay | Admitting: Medical

## 2024-02-11 ENCOUNTER — Ambulatory Visit (INDEPENDENT_AMBULATORY_CARE_PROVIDER_SITE_OTHER): Payer: 59 | Admitting: Medical

## 2024-02-11 VITALS — BP 120/70 | HR 68 | Ht 66.5 in | Wt 175.2 lb

## 2024-02-11 DIAGNOSIS — Z789 Other specified health status: Secondary | ICD-10-CM | POA: Diagnosis not present

## 2024-02-11 DIAGNOSIS — E559 Vitamin D deficiency, unspecified: Secondary | ICD-10-CM

## 2024-02-11 DIAGNOSIS — Z13220 Encounter for screening for lipoid disorders: Secondary | ICD-10-CM

## 2024-02-11 DIAGNOSIS — G479 Sleep disorder, unspecified: Secondary | ICD-10-CM | POA: Diagnosis not present

## 2024-02-11 DIAGNOSIS — Z125 Encounter for screening for malignant neoplasm of prostate: Secondary | ICD-10-CM

## 2024-02-11 DIAGNOSIS — G4733 Obstructive sleep apnea (adult) (pediatric): Secondary | ICD-10-CM | POA: Diagnosis not present

## 2024-02-11 DIAGNOSIS — Z8349 Family history of other endocrine, nutritional and metabolic diseases: Secondary | ICD-10-CM

## 2024-02-11 DIAGNOSIS — Z0000 Encounter for general adult medical examination without abnormal findings: Secondary | ICD-10-CM

## 2024-02-11 NOTE — Progress Notes (Signed)
 Ordered DME cpap through [facility] with [employer] through [employer]

## 2024-02-11 NOTE — Progress Notes (Signed)
 Subjective:   HPI  Ricardo Gibbs is a 48 y.o. male who presents for Chief Complaint  Patient presents with   Annual Exam    Fasting cpe, no concerns    Patient Care Team: Atarah Cadogan, Kermit Balo, PA-C as PCP - General (Family Medicine) Wynona Canes, MD as Referring Physician (Specialist) Dr. Erick Blinks, GI   Concerns: Here for well visit, fasting.   Last year had colonoscopy, CT coronary.   Doing some home remodeling.  Not sleeping all that well.  Last year had abnormal OSA, but never pursued CPAP.    Has trouble staying asleep.  Drinks several beers nightly to help with sleep  Reviewed their medical, surgical, family, social, medication, and allergy history and updated chart as appropriate.  No Known Allergies  Past Medical History:  Diagnosis Date   Allergy    SEASONAL   Chronic urticaria 09/12/2016   Environmental allergies    Pecan Pollen   GERD (gastroesophageal reflux disease)    Hx of angioedema    Hyperlipidemia    Rash    Sleep apnea    NO CPAP    Current Outpatient Medications on File Prior to Visit  Medication Sig Dispense Refill   cetirizine (ZYRTEC) 10 MG tablet Take by mouth.     Cholecalciferol (VITAMIN D3) 50 MCG (2000 UT) CAPS Take 1 capsule by mouth daily.     ibuprofen (ADVIL) 600 MG tablet Take 1 tablet (600 mg total) by mouth every 8 (eight) hours as needed. 30 tablet 0   triamcinolone ointment (KENALOG) 0.1 % as needed. (Patient not taking: Reported on 10/11/2023)     No current facility-administered medications on file prior to visit.      Current Outpatient Medications:    cetirizine (ZYRTEC) 10 MG tablet, Take by mouth., Disp: , Rfl:    Cholecalciferol (VITAMIN D3) 50 MCG (2000 UT) CAPS, Take 1 capsule by mouth daily., Disp: , Rfl:    ibuprofen (ADVIL) 600 MG tablet, Take 1 tablet (600 mg total) by mouth every 8 (eight) hours as needed., Disp: 30 tablet, Rfl: 0   triamcinolone ointment (KENALOG) 0.1 %, as needed. (Patient not taking:  Reported on 10/11/2023), Disp: , Rfl:   Family History  Problem Relation Age of Onset   Colon polyps Mother    Thyroid disease Mother        Removal of the Thyroid   Colon polyps Father    Depression Brother    Stomach cancer Paternal Grandmother    Allergic rhinitis Neg Hx    Angioedema Neg Hx    Asthma Neg Hx    Eczema Neg Hx    Immunodeficiency Neg Hx    Urticaria Neg Hx    Colon cancer Neg Hx    Crohn's disease Neg Hx    Esophageal cancer Neg Hx    Rectal cancer Neg Hx    Ulcerative colitis Neg Hx     Past Surgical History:  Procedure Laterality Date   COLONOSCOPY  04/2023   polyps, repeat 2034, Dr. Erick Blinks   CYST EXCISION  2025   right jaw, skin cyst   Review of Systems  Constitutional:  Negative for chills, fever, malaise/fatigue and weight loss.  HENT:  Negative for congestion, ear pain, hearing loss, sore throat and tinnitus.   Eyes:  Negative for blurred vision, pain and redness.  Respiratory:  Negative for cough, hemoptysis and shortness of breath.   Cardiovascular:  Negative for chest pain, palpitations, orthopnea, claudication and leg  swelling.  Gastrointestinal:  Negative for abdominal pain, blood in stool, constipation, diarrhea, nausea and vomiting.  Genitourinary:  Negative for dysuria, flank pain, frequency, hematuria and urgency.  Musculoskeletal:  Negative for falls, joint pain and myalgias.  Skin:  Negative for itching and rash.  Neurological:  Negative for dizziness, tingling, speech change, weakness and headaches.  Endo/Heme/Allergies:  Negative for polydipsia. Does not bruise/bleed easily.  Psychiatric/Behavioral:  Negative for depression and memory loss. The patient is not nervous/anxious and does not have insomnia.       Objective:  BP 120/70   Pulse 68   Ht 5' 6.5" (1.689 m)   Wt 175 lb 3.2 oz (79.5 kg)   BMI 27.85 kg/m   Wt Readings from Last 3 Encounters:  02/11/24 175 lb 3.2 oz (79.5 kg)  11/13/23 177 lb 12.8 oz (80.6 kg)   10/11/23 177 lb (80.3 kg)    General appearance: alert, no distress, WD/WN, Asian male Skin: Scattered macules.  Right lower abdomen and right upper inner thigh with 2 individual oval darker approximately 3 mm x 2 mm flat macule that he says is unchanged, birthmark left lower back at approximately 2 cm x 1 cm HEENT: normocephalic, conjunctiva/corneas normal, sclerae anicteric, PERRLA, EOMi, nares patent, no discharge or erythema, pharynx normal Oral cavity: MMM, tongue normal, teeth normal Neck: supple, no lymphadenopathy, no thyromegaly, no masses, normal ROM, no bruits Chest: non tender, normal shape and expansion Heart: RRR, normal S1, S2, no murmurs Lungs: CTA bilaterally, no wheezes, rhonchi, or rales Abdomen: +bs, soft, non tender, non distended, no masses, no hepatomegaly, no splenomegaly, no bruits Back: non tender, normal ROM, no scoliosis Musculoskeletal: upper extremities non tender, no obvious deformity, normal ROM throughout, lower extremities non tender, no obvious deformity, normal ROM throughout Extremities: no edema, no cyanosis, no clubbing Pulses: 2+ symmetric, upper and lower extremities, normal cap refill Neurological: alert, oriented x 3, CN2-12 intact, strength normal upper extremities and lower extremities, sensation normal throughout, DTRs 2+ throughout, no cerebellar signs, gait normal Psychiatric: normal affect, behavior normal, pleasant  GU: deferred Rectal: Deferred    Assessment and Plan :   Encounter Diagnoses  Name Primary?   Encounter for health maintenance examination in adult Yes   Drinks alcohol    Sleep disturbance    OSA (obstructive sleep apnea)    Screening for prostate cancer    Screening for lipid disorders    Family history of thyroid disorder    Vitamin D deficiency     This visit was a preventative care visit, also known as wellness visit or routine physical.   Topics typically include healthy lifestyle, diet, exercise, preventative  care, vaccinations, sick and well care, proper use of emergency dept and after hours care, as well as other concerns.     Separate significant issues discussed: Vitamin D deficiency-updated labs today  OSA-his sleep study last year was abnormal AHI 22 in desaturation.  He never tried CPAP last year.  We discussed the findings and potential complications of sleep apnea.  I encouraged him to try again.  He is willing for CPAP referral today.  I recommend he cut back on alcohol as he is drinking several beers per night to help with sleep.  We discussed other strategies to help with sleep.    General Recommendations: Continue to return yearly for your annual wellness and preventative care visits.  This gives [place] a chance to discuss healthy lifestyle, exercise, vaccinations, review your chart record, and perform screenings where  appropriate.  I recommend you see your eye doctor yearly for routine vision care.  I recommend you see your dentist yearly for routine dental care including hygiene visits twice yearly.   Vaccination  Immunization History  Administered Date(s) Administered   Tdap 04/20/2022    Consider yearly flu shot   Screening for cancer: Colon cancer screening: I reviewed your 04/2023 colonoscopy.  Polyps biopsies.  Repeat planned in 2034.  Testicular cancer screening You should do a monthly self testicular exam if you are between 8-17 years old, and we typically do a testicular exam on the yearly physical for this same age group.   Prostate Cancer screening: The recommended prostate cancer screening test is a blood test called the prostate-specific antigen (PSA) test. PSA is a protein that is made in the prostate. As you age, your prostate naturally produces more PSA. Abnormally high PSA levels may be caused by: Prostate cancer. An enlarged prostate that is not caused by cancer (benign prostatic hyperplasia, or BPH). This condition is very common in older men. A  prostate gland infection (prostatitis) or urinary tract infection. Certain medicines such as male hormones (like testosterone) or other medicines that raise testosterone levels. A rectal exam may be done as part of prostate cancer screening to help provide information about the size of your prostate gland. When a rectal exam is performed, it should be done after the PSA level is drawn to avoid any effect on the results.   Skin cancer screening: Check your skin regularly for new changes, growing lesions, or other lesions of concern Come in for evaluation if you have skin lesions of concern.   Lung cancer screening: If you have a greater than 20 pack year history of tobacco use, then you may qualify for lung cancer screening with a chest CT scan.   Please call your insurance company to inquire about coverage for this test.   Pancreatic cancer:  no current screening test is available or routinely recommended. (risk factors: smoking, overweight or obese, diabetes, chronic pancreatitis, work exposure - dry cleaning, metal working, 48yo>, M>F, Tree surgeon, family hx/o, hereditary breast, ovarian, melanoma, lynch, peutz-jeghers).  Symptoms: jaundice, dark urine, light color or greasy stools, itchy skin, belly or back pain, weight loss, poor appetite, nausea, vomiting, liver enlargement, DVT/blood clots.   We currently don't have screenings for other cancers besides breast, cervical, colon, and lung cancers.  If you have a strong family history of cancer or have other cancer screening concerns, please let me know.  Genetic testing referral is an option for individuals with high cancer risk in the family.  There are some other cancer screenings in development currently.   Bone health: Get at least 150 minutes of aerobic exercise weekly Get weight bearing exercise at least once weekly Bone density test:  A bone density test is an imaging test that uses a type of X-ray to measure the amount of  calcium and other minerals in your bones. The test may be used to diagnose or screen you for a condition that causes weak or thin bones (osteoporosis), predict your risk for a broken bone (fracture), or determine how well your osteoporosis treatment is working. The bone density test is recommended for females 65 and older, or females or males <65 if certain risk factors such as thyroid disease, long term use of steroids such as for asthma or rheumatological issues, vitamin D deficiency, estrogen deficiency, family history of osteoporosis, self or family history of fragility fracture in first  degree relative.    Heart health: Get at least 150 minutes of aerobic exercise weekly Limit alcohol It is important to maintain a healthy blood pressure and healthy cholesterol numbers  Heart disease screening: Screening for heart disease includes screening for blood pressure, fasting lipids, glucose/diabetes screening, BMI height to weight ratio, reviewed of smoking status, physical activity, and diet.    Goals include blood pressure 120/80 or less, maintaining a healthy lipid/cholesterol profile, preventing diabetes or keeping diabetes numbers under good control, not smoking or using tobacco products, exercising most days per week or at least 150 minutes per week of exercise, and eating healthy variety of fruits and vegetables, healthy oils, and avoiding unhealthy food choices like fried food, fast food, high sugar and high cholesterol foods.    Other tests may possibly include EKG test, CT coronary calcium score, echocardiogram, exercise treadmill stress test.     CT coronary 02/2023 with calcium score of zero.     Vascular disease screening: For higher risk individuals including smokers, diabetics, patients with known heart disease or high blood pressure, kidney disease, and others, screening for vascular disease or atherosclerosis of the arteries is available.  Examples may include carotid ultrasound,  abdominal aortic ultrasound, ABI blood flow screening in the legs, thoracic aorta screening.   Medical care options: I recommend you continue to seek care here first for routine care.  We try really hard to have available appointments Monday through Friday daytime hours for sick visits, acute visits, and physicals.  Urgent care should be used for after hours and weekends for significant issues that cannot wait till the next day.  The emergency department should be used for significant potentially life-threatening emergencies.  The emergency department is expensive, can often have long wait times for less significant concerns, so try to utilize primary care, urgent care, or telemedicine when possible to avoid unnecessary trips to the emergency department.  Virtual visits and telemedicine have been introduced since the pandemic started in 2020, and can be convenient ways to receive medical care.  We offer virtual appointments as well to assist you in a variety of options to seek medical care.   Legal  Take the time to do a last will and testament, Advanced Directives including Health Care Power of Attorney and Living Will documents.  Don't leave your family with burdens that can be handled ahead of time.   Advanced Directives: I recommend you consider completing a Health Care Power of Attorney and Living Will.   These documents respect your wishes and help alleviate burdens on your loved ones if you were to become terminally ill or be in a position to need those documents enforced.    You can complete Advanced Directives yourself, have them notarized, then have copies made for our office, for you and for anybody you feel should have them in safe keeping.  Or, you can have an attorney prepare these documents.   If you haven't updated your Last Will and Testament in a while, it may be worthwhile having an attorney prepare these documents together and save on some costs.       Spiritual and Emotional  Health Keeping a healthy spiritual life can help you better manage your physical health. Your spiritual life can help you to cope with any issues that may arise with your physical health.  Balance can keep [place] healthy and help [place] to recover.  If you are struggling with your spiritual health there are questions that you may want  to ask yourself:  What makes me feel most complete? When do I feel most connected to the rest of the world? Where do I find the most inner strength? What am I doing when I feel whole?  Helpful tips: Being in nature. Some people feel very connected and at peace when they are walking outdoors or are outside. Helping others. Some feel the largest sense of wellbeing when they are of service to others. Being of service can take on many forms. It can be doing volunteer work, being kind to strangers, or offering a hand to a friend in need. Gratitude. Some people find they feel the most connected when they remain grateful. They may make lists of all the things they are grateful for or say a thank you out loud for all they have.    Emotional Health Are you in tune with your emotional health?  Check out this link: http://www.marquez-love.com/    Financial Health Make sure you use a budget for your personal finances Make sure you are insured against risks (health insurance, life insurance, auto insurance, etc) Save more, spend less Set financial goals If you need help in this area, good resources include counseling through [employer] or other community resources, have a meeting with a Social research officer, government, and a good resource is the [employer] was seen today for annual exam.  Diagnoses and all orders for this visit:  Encounter for health maintenance examination in adult -     Urinalysis, Routine w reflex microscopic -     Comprehensive metabolic panel -     CBC -     Lipid panel -     PSA -     TSH + free T4 -     VITAMIN D 25  Hydroxy (Vit-D Deficiency, Fractures)  Drinks alcohol  Sleep disturbance  OSA (obstructive sleep apnea)  Screening for prostate cancer -     PSA  Screening for lipid disorders -     Lipid panel  Family history of thyroid disorder -     TSH + free T4  Vitamin D deficiency -     VITAMIN D 25 Hydroxy (Vit-D Deficiency, Fractures)    Follow-up pending labs, yearly for physical

## 2024-02-11 NOTE — Addendum Note (Signed)
 Addended by: Herminio Commons A on: 02/11/2024 09:49 AM   Modules accepted: Orders

## 2024-02-12 LAB — LIPID PANEL
Chol/HDL Ratio: 3.7 ratio (ref 0.0–5.0)
Cholesterol, Total: 206 mg/dL — ABNORMAL HIGH (ref 100–199)
HDL: 55 mg/dL (ref >39)
LDL Chol Calc (NIH): 124 mg/dL — ABNORMAL HIGH (ref 0–99)
Triglycerides: 152 mg/dL — ABNORMAL HIGH (ref 0–149)
VLDL Cholesterol Cal: 27 mg/dL (ref 5–40)

## 2024-02-12 LAB — URINALYSIS, ROUTINE W REFLEX MICROSCOPIC
Bilirubin, UA: NEGATIVE
Glucose, UA: NEGATIVE
Ketones, UA: NEGATIVE
Leukocytes,UA: NEGATIVE
Nitrite, UA: NEGATIVE
Protein,UA: NEGATIVE
RBC, UA: NEGATIVE
Specific Gravity, UA: 1.023 (ref 1.005–1.030)
Urobilinogen, Ur: 0.2 mg/dL (ref 0.2–1.0)
pH, UA: 6.5 (ref 5.0–7.5)

## 2024-02-12 LAB — COMPREHENSIVE METABOLIC PANEL WITH GFR
ALT: 29 [IU]/L (ref 0–44)
AST: 22 [IU]/L (ref 0–40)
Albumin: 4.6 g/dL (ref 4.1–5.1)
Alkaline Phosphatase: 99 [IU]/L (ref 44–121)
BUN/Creatinine Ratio: 12 (ref 9–20)
BUN: 14 mg/dL (ref 6–24)
Bilirubin Total: 0.3 mg/dL (ref 0.0–1.2)
CO2: 21 mmol/L (ref 20–29)
Calcium: 9.5 mg/dL (ref 8.7–10.2)
Chloride: 103 mmol/L (ref 96–106)
Creatinine, Ser: 1.13 mg/dL (ref 0.76–1.27)
Globulin, Total: 2.3 g/dL (ref 1.5–4.5)
Glucose: 97 mg/dL (ref 70–99)
Potassium: 4.3 mmol/L (ref 3.5–5.2)
Sodium: 139 mmol/L (ref 134–144)
Total Protein: 6.9 g/dL (ref 6.0–8.5)
eGFR: 81 mL/min/{1.73_m2}

## 2024-02-12 LAB — CBC
Hematocrit: 50.5 % (ref 37.5–51.0)
Hemoglobin: 16.6 g/dL (ref 13.0–17.7)
MCH: 30.3 pg (ref 26.6–33.0)
MCHC: 32.9 g/dL (ref 31.5–35.7)
MCV: 92 fL (ref 79–97)
Platelets: 285 10*3/uL (ref 150–450)
RBC: 5.48 x10E6/uL (ref 4.14–5.80)
RDW: 13 % (ref 11.6–15.4)
WBC: 5.8 10*3/uL (ref 3.4–10.8)

## 2024-02-12 LAB — TSH+FREE T4
Free T4: 1.2 ng/dL (ref 0.82–1.77)
TSH: 3.75 u[IU]/mL (ref 0.450–4.500)

## 2024-02-12 LAB — PSA: Prostate Specific Ag, Serum: 0.8 ng/mL (ref 0.0–4.0)

## 2024-02-12 LAB — VITAMIN D 25 HYDROXY (VIT D DEFICIENCY, FRACTURES): Vit D, 25-Hydroxy: 59.2 ng/mL (ref 30.0–100.0)

## 2024-02-12 NOTE — Progress Notes (Signed)
 Results sent through MyChart

## 2024-05-06 ENCOUNTER — Ambulatory Visit: Payer: Self-pay
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
## 2024-05-06 NOTE — Telephone Encounter (Signed)
 Pt is scheduled for tomorrow with Dr. Monnie Anthony
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
## 2024-05-07 ENCOUNTER — Ambulatory Visit: Admitting: Family Medicine

## 2024-05-07 ENCOUNTER — Ambulatory Visit
Admission: RE | Admit: 2024-05-07 | Discharge: 2024-05-07 | Disposition: A | Discharge status: Home / Self Care | Source: Ambulatory Visit | Attending: Family Medicine | Admitting: Family Medicine

## 2024-05-07 ENCOUNTER — Encounter: Payer: Self-pay | Admitting: Family Medicine

## 2024-05-07 VITALS — BP 124/72 | HR 70 | Ht 66.5 in | Wt 173.8 lb

## 2024-05-07 DIAGNOSIS — M25551 Pain in right hip: Secondary | ICD-10-CM

## 2024-05-07 DIAGNOSIS — M545 Low back pain, unspecified: Secondary | ICD-10-CM

## 2024-05-07 MED ORDER — MELOXICAM 15 MG PO TABS
15.0000 mg | ORAL_TABLET | Freq: Every day | ORAL | 0 refills | Status: AC
Start: 2024-05-07 — End: ?

## 2024-05-07 NOTE — Patient Instructions (Addendum)
 Go to [address] [facility]) for x-rays.  Try heating pad to the low back and hip area.  Take meloxicam once daily with food.  You can cut the dose in half if it bothers your stomach. Do not take ibuprofen  (advil /motrin ) or Aleve (naproxen) or BC or Goody powder while taking the meloxicam. You may continue to use Tylenol  if needed.  If you find your back is locking up and having more muscle spasm, let us  know and we can send in a muscle relaxant.  If your pain isn't resolving with the anti-inflammatory, heat, stretches and massage, contact us .  Next steps might be physical therapy (assuming that your x-rays are normal). If x-rays aren't normal, we will send you to an orthopedist.

## 2024-05-15 ENCOUNTER — Ambulatory Visit: Payer: Self-pay | Admitting: Family Medicine

## 2024-05-15 NOTE — Progress Notes (Signed)
 They will escalate and read.

## 2024-06-09 ENCOUNTER — Other Ambulatory Visit

## 2025-02-23 ENCOUNTER — Encounter: Payer: Self-pay | Admitting: Medical
